# Patient Record
Sex: Male | Born: 1950 | Race: White | Hispanic: No | Marital: Single
Health system: Southern US, Community
[De-identification: ages and names within clinical notes are randomized; demographics above are authoritative.]

## PROBLEM LIST (undated history)

## (undated) DIAGNOSIS — K279 Peptic ulcer, site unspecified, unspecified as acute or chronic, without hemorrhage or perforation: Secondary | ICD-10-CM

## (undated) DIAGNOSIS — F1020 Alcohol dependence, uncomplicated: Secondary | ICD-10-CM

## (undated) DIAGNOSIS — I609 Nontraumatic subarachnoid hemorrhage, unspecified: Secondary | ICD-10-CM

## (undated) HISTORY — PX: BACK SURGERY: SHX140

## (undated) HISTORY — PX: OTHER SURGICAL HISTORY: SHX169

---

## 2003-08-04 ENCOUNTER — Emergency Department (HOSPITAL_COMMUNITY): Admission: EM | Admit: 2003-08-04 | Discharge: 2003-08-04 | Payer: Self-pay | Admitting: Emergency Medicine

## 2006-12-04 ENCOUNTER — Inpatient Hospital Stay (HOSPITAL_COMMUNITY): Admission: EM | Admit: 2006-12-04 | Discharge: 2006-12-07 | Payer: Self-pay | Admitting: Emergency Medicine

## 2006-12-04 ENCOUNTER — Emergency Department (HOSPITAL_COMMUNITY): Admission: EM | Admit: 2006-12-04 | Discharge: 2006-12-04 | Payer: Self-pay | Admitting: Emergency Medicine

## 2006-12-05 ENCOUNTER — Ambulatory Visit: Payer: Self-pay | Admitting: Gastroenterology

## 2006-12-06 ENCOUNTER — Encounter: Payer: Self-pay | Admitting: Internal Medicine

## 2006-12-06 ENCOUNTER — Ambulatory Visit: Payer: Self-pay | Admitting: Internal Medicine

## 2006-12-07 ENCOUNTER — Emergency Department (HOSPITAL_COMMUNITY): Admission: EM | Admit: 2006-12-07 | Discharge: 2006-12-07 | Payer: Self-pay | Admitting: Emergency Medicine

## 2010-11-02 NOTE — Discharge Summary (Signed)
NAMEBRODYN, DEPUY                ACCOUNT NO.:  [id number]   MEDICAL RECORD NO.:  [id number]          PATIENT TYPE:  INP   LOCATION:  A214                          FACILITY:  [facility]   PHYSICIAN:  Marcello Moores, MD   DATE OF BIRTH:  07-08-1950   DATE OF ADMISSION:  DATE OF DISCHARGE:  LH                               DISCHARGE SUMMARY   PRIMARY MEDICAL DOCTOR:  Unassigned.   DISCHARGE DIAGNOSES:  1. Alcohol intoxication, resolved.  2. Microcytic anemia of unknown etiology.  He will have follow-up with      gastroenterology as well as with hematologist.  3. Polysubstance abuse.   HOME MEDICATIONS:  1. Ferrous sulfate 325 mg p.o. t.i.d.  2. Thiamine 100 mg p.o. daily.  3. Folic acid 1 mg p.o. daily.  4. Multivitamin 1 tablet p.o. daily.   HOSPITAL COURSE:  Mr. Tantillo is a 60 year old male patient who presented  with alcohol intoxication and his alcohol level was more than 200, and  he was admitted to the hospital.  His hemoglobin was also low, 6.3, and  his MCV was 56, and the patient was transfused with 2 units of packed  red blood cells.  GI was consulted and evaluated by Dr. Roetta Sessions  The EGD finding was diffuse cobblestoning of the esophageal mucosa and  post Billroth of the esophageal mucosa and biopsy was taken, and a small  area hernia was also noticed and status post Billroth II second  hemigastrectomy was also noticed.  Colonoscopy was also done and showed  friable internal hemorrhoids and left-sided diverticula with small  descending colon __________, otherwise was normal and a biopsy was also  taken.  The stool guaiac was negative.  The patient was put on Protonix  and for his alcohol intoxication, he was put also on Ativan protocol and  the patient remained very stable.  His hemoglobin level post transfusion  and GI procedures was very stable.  His last hemoglobin before discharge  was 8.7 and hematocrit was 27.3 with MCV 67.5.  the patient remained  very  stable and he was assessed by ACT team for possible rehab, but he  declined it.  Today the patient was very stable with vital signs,  temperature 97, pulse rate 64 and respiratory rate 20 and blood pressure  111/68 and saturation 100%, and he is alert and well-oriented and there  was not any sign of alcohol withdrawal symptoms   DISCHARGE PLAN:  I discussed with the patient in detail about his anemia  and recognized origin and etiology and possible workup and I gave him a  strong recommendation to have follow-up with GI and follow for his  pathology result as well within 1-2 weeks, and appointment was given to  be evaluated by hematologist Dr. Mariel Sleet, and  strongly advised and he agreed, and I discussed with his sister, Windell Moulding,  also about his situation and the need of his follow-up, and both of them  understand and they agreed, and the patient was discharged with  supplementary medications.      Marcello Moores, MD  Electronically Signed  MT/MEDQ  D:  12/07/2006  T:  12/08/2006  Job:  [id number]

## 2010-11-02 NOTE — Op Note (Signed)
Joseph Townsend, Joseph Townsend                ACCOUNT NO.:  [id number]   MEDICAL RECORD NO.:  [id number]          PATIENT TYPE:  INP   LOCATION:  A214                          FACILITY:  [facility]   PHYSICIAN:  R. Roetta Sessions, M.D. DATE OF BIRTH:  08-26-1950   DATE OF PROCEDURE:  12/06/2006  DATE OF DISCHARGE:                               OPERATIVE REPORT   PROCEDURE:  Esophagogastroduodenoscopy with biopsy, esophageal  brushings, followed by colonoscopy and biopsy.   INDICATIONS FOR PROCEDURE:  60 year old Caucasian male alcoholic  admitted with profound anemia requiring transfusion. We do not have any  documentation that he has had GI bleeding.  He presented with a  hemoglobin in the 6 range and has required 4 units of packed red blood  cells, thus far. History with complicated peptic ulcer disease requiring  surgery decades ago at [facility]. EGD and colonoscopy are now  being done.  This approach has been discussed with the patient at  length.  The potential risks and benefits and have been reviewed,  questions answered, he is agreeable.  Please see documentation in the  medical record.   PROCEDURE NOTE:  O2 saturation, blood pressure, pulse oximetry were  monitored throughout the entirety of both procedures.  Conscious  sedation with Versed 6 mg IV and Demerol 100 mg IV in divided doses.  Cetacaine spray for topical pharyngeal anesthesia.  Instrumentation  Pentax video chip system.   ESOPHAGOGASTRODUODENOSCOPY FINDINGS:  Examination of the tubular  esophagus revealed diffusely abnormal esophageal mucosa with a  cobblestoning appearance to the mucosa.  This did not appear to be an  obvious viral or even fungal process, likewise, findings were atypical  for erosive reflux esophagitis.  There were no varices.  Please see  multiple photos taken.  The esophagus was patent and easily traversed.  Stomach has been altered surgically previously.  He has a Billroth II  configuration.  He  has a relatively small gastric pouch.  He has patent  efferent and afferent limbs.  The residual gastric mucosa appeared  normal.  Retroflexion of the proximal stomach esophagogastric junction  demonstrated only a small hiatal hernia.   THERAPEUTIC/DIAGNOSTIC MANEUVERS PERFORMED:  Biopsies and brushing of  the esophageal mucosa were taken for further studies.  The patient  tolerated the procedure well and was prepared for colonoscopy.   COLONOSCOPY FINDINGS:  Digital rectal examination revealed no  abnormalities.  The prep was marginal.  The colonic mucosa was surveyed  from the rectosigmoid junction to the left, transverse, right colon, to  the area of the appendiceal orifice and ileocecal valve and cecum.  These structures were well seen and photographed for the record.  From  this level, the scope was slowly withdrawn.  All previous mentioned  mucosal surfaces were again seen.  The patient had numerous left sided  diverticula and a diminutive 3-4 mm polyp in the mid descending colon  which was cold biopsied removed/.  The remainder of the colonic mucosa  appeared normal.  The scope was pulled down into the rectum where a  thorough examination of the rectal mucosa including  retroflexed view of  the anal verge demonstrated a prominent friable internal hemorrhoidal  plexus. Otherwise, the rectal mucosa appeared normal.  The patient  tolerated both procedures well and was reacted in endoscopy.   IMPRESSION:  EGD:  Diffuse cobblestoning of the esophageal mucosa,  this is abnormal mucosa, the etiology of which is not well defined at  this time, status post biopsy and KOH brush. Small hiatal hernia: Status  post Billroth II hemigastrectomy, residual gastric mucosa efferent and  afferent limbs appeared normal.  Colonoscopy:  Friable internal hemorrhoids, otherwise, normal rectum.  Left sided diverticula. Diminutive descending colon polyp, cold  biopsy/removed.  The remainder of the colonic  mucosa appeared normal.   RECOMMENDATIONS:  1. Continue proton pump inhibitor therapy.  2. Advance diet as tolerated.  3. Follow-up on path.  4. Would consider other (not GI) causes of anemia.  5. Recheck CBC tomorrow morning.   I discussed my findings with Lindaann Pascal, the patient's sister,  after the procedure.      Jonathon Bellows, M.D.  Electronically Signed     RMR/MEDQ  D:  12/06/2006  T:  12/06/2006  Job:  [id number]

## 2010-11-02 NOTE — H&P (Signed)
NAMECLAYBORN, Joseph Townsend                ACCOUNT NO.:  [id number]   MEDICAL RECORD NO.:  [id number]          PATIENT TYPE:  INP   LOCATION:  A214                          FACILITY:  [facility]   PHYSICIAN:  Skeet Latch, DO    DATE OF BIRTH:  09-Jun-1951   DATE OF ADMISSION:  12/04/2006  DATE OF DISCHARGE:  LH                              HISTORY & PHYSICAL   CHIEF COMPLAINT:  Alcohol intoxication and involuntary commitment.   HISTORY OF PRESENT ILLNESS:  This is a 60 year old male who presents  with alcohol intoxication.  Apparently, he was brought in I believe by  his wife secondary to his alcohol abuse.  The patient was in the  emergency room at [facility] yesterday and for the same  complaint and left against medical advice.  He returns with wife, who  wants the patient treated for his alcohol abuse.  On exam in the  emergency room, the patient was found to have a hemoglobin of 6.3, white  count 13.1, hematocrit of 20.4, MCV of 56.2.  The patient was  intoxicated, but cooperative.  He was found to have __________.   PAST MEDICAL HISTORY:  Positive for alcohol and tobacco abuse.   SURGICAL HISTORY:  Positive for back surgery secondary to ruptured disk  and surgery for I believe a stomach ulcer.   SOCIAL HISTORY:  He is a heavy drinker; he states he drinks wine daily.  He also states he is a pack-a-day smoker for approximately 40 years.  Denies any illicit drug use.   ALLERGIES:  No known drug allergies.   HOME MEDICATIONS:  None.   REVIEW OF SYSTEMS:  GENERAL:  No fever, chills, weight loss, weight gain  or fatigue.  GI:  Positive for some nausea and no vomiting or diarrhea.  Positive for some epigastric tenderness.  HEENT:  No diplopia, earache,  sore throat.  No visual disturbances.  NEUROLOGIC:  No headache,  confusion, weakness or dizziness.  PSYCHIATRIC:  No history of  depression or anxiety.  All other systems are negative.   PHYSICAL EXAMINATION:  GENERAL:  He is  hydrated, in no acute distress,  pleasant and cooperative and appears stated age.  VITAL SIGNS:  Temperature is 98.3, pulse 96, respirations 22, blood  pressure is 115/67.  The patient is saturating 97%.  HEENT:  Atraumatic, normocephalic.  PERRL.  EOMI.  NECK:  Supple, nontender and non-distended.  No JVD.  No thyromegaly  appreciated.  CARDIOVASCULAR:  Regular rate and rhythm.  No murmurs, rubs, or gallops.  RESPIRATORY:  Breath sounds are equal bilaterally.  No rales, rhonchi or  wheezing.  ABDOMEN:  Soft.  He had some tenderness in the epigastric region and  right upper quadrant on palpation, no masses, guarding or rebound  tenderness.  Positive bowel sounds.  EXTREMITIES:  Bilateral lower extremities cachectic.  No tenderness or  edema noted.  NEUROLOGIC:  Cranial nerves II-XII are grossly intact.  Sensation  normal.  He is alert and oriented x3.   LABORATORY DATA:  White blood cell count is 13.1, hemoglobin 6.3,  hematocrit 20.4,  MCV 66.2, platelets 390,000.  Sodium 136, potassium  4.8, chloride is 103, CO2 is 21, glucose 142, BUN 14, creatinine 1.01.  Calcium is 9.1.  His alcohol level was 229.  His urine drug screen was  negative.   ASSESSMENT:  This is a 60 year old who presents with alcohol  intoxication.  The patient was brought in I believe by his wife for his  alcohol abuse.  The patient was seen in the emergency room 1 day prior  for same complaint and left against medical advice.  He presents today  with the same complaint and states that he does want help at this time.  Upon evaluation in the emergency room, he was found to have a hemoglobin  of 6.3, but in no acute distress.   PLAN:  The patient will be admitted for anemia of chronic disease and  alcohol intoxication.  The patient will be transfused 2 units of packed  red blood cells and have a repeat H&H.  The patient will be placed on  Ativan withdrawal protocol and will have a sitter in the room with him.  The  patient will be maintained on IV hydration.  We will also get a  hepatitis and liver panel.  Blood cultures will also be performed.  The  patient will also have anemia panel including iron studies, B12 and  folate performed also.  The patient will need alcohol rehab performed as  an outpatient.  The patient will be followed closely.      Skeet Latch, DO  Electronically Signed     SM/MEDQ  D:  12/05/2006  T:  12/05/2006  Job:  [id number]

## 2010-11-02 NOTE — Consult Note (Signed)
NAMECREIG, LANDIN                ACCOUNT NO.:  [id number]   MEDICAL RECORD NO.:  [id number]          PATIENT TYPE:  INP   LOCATION:  A214                          FACILITY:  [facility]   PHYSICIAN:  Kassie Mends, M.D.      DATE OF BIRTH:  03-Oct-1950   DATE OF CONSULTATION:  12/05/2006  DATE OF DISCHARGE:                                 CONSULTATION   REASON FOR CONSULTATION:  Anemia.   REQUESTING PHYSICIAN:  Candace Bradley   HISTORY OF PRESENT ILLNESS:  Mr. Juncaj is a 60 year old male who has a  significant past medical history of peptic ulcer disease.  He is a poor  historian.  He reports that he had surgery for an ulcer at [facility] by Dr. Kerney Elbe who is currently deceased some years  ago.  He reports having perhaps two upper endoscopies.  He does not  recall ever having rubber bands placed at the base of his esophagus.  He  does have a history of alcohol abuse.  He denies vomiting up any blood.  He has not had any weight loss or decreased appetite.  He denies any  abdominal pain.  He denies any black stool that looks like tar.  He  denies any bright red blood per rectum.  He has not had any change in  his bowel habits.  His visit to the emergency department on the 16th was  uneventful and he was discharged.  In the emergency department, his  blood pressure was 131/66 and his pulse was 117.  His BUN was 14; in his  creatinine was 1.01.  The majority of the history is obtained from the  electronic medical record because the patient is a poor historia. He was  brought back to the emergency department on the 17th by his wife and  involuntarily committed. His lab evaluation revealed a hemoglobin of 6.3  with an MCV of 56.2.   PAST MEDICAL HISTORY:  1. Alcohol abuse.  2. Tobacco abuse.   PAST SURGICAL HISTORY:  1. Back surgery secondary to ruptured disk.  2. Abdominal surgery secondary to peptic ulcer disease.   ALLERGIES:  No known drug allergies.   MEDICATIONS:  1. Colace 100 mg daily.  2. Ativan/alcohol withdrawal protocol.  3. Multivitamin.  4. Protonix 40 mg daily.  5. Pneumovax times one.  6. Thiamine 100 mg daily.  7. Tylenol as needed.   FAMILY HISTORY:  He denies any family history of colon cancer or colon  polyps.   SOCIAL HISTORY:  He does drink alcohol.  The emergency department notes  states that he drinks 6  to 8 pints of wine per day.  He is married.  He  denies having any children.  He smokes.  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  He gives  his produce away.   REVIEW OF SYSTEMS:  He denied any nausea or vomiting.  He denied any  abdominal pain or diarrhea.  His review of systems is as per the HPI;  otherwise, all systems are  negative.   PHYSICAL EXAMINATION:  VITAL SIGNS:  Temperature 97.9, systolic blood  pressure 115 to 142, heart rate 71 to 83.  GENERAL:  He is in no apparent distress, alert and interactive.  He  answers questions appropriately.HEENT:  Atraumatic normocephalic.  Pupils equal and react to light.  Mouth no oral lesions.  Posterior  pharynx without erythema or exudate.NECK:  Full range of motion, no  lymphadenopathy.LUNGS:  Clear to auscultation bilaterally.  CARDIOVASCULAR EXAM:  Regular rhythm, no murmur, normal S1 and  S2.ABDOMEN:  Bowel sounds are present, soft, nontender, nondistended.  No rebound or guarding.  He has an incision on his abdomen, which is  well healed.  No abdominal bruits or pulsatile masses. EXTREMITIES:  Without clubbing, cyanosis or edema.  NEUROLOGIC:  He has no focal or neurologic deficits.   LABS:  White count 9.6, hemoglobin 6.7, platelet count 176.  BUN 14,  creatinine 1.0, albumin 4.5, AST 23, ALT 15, calcium 9.1.  Alcohol level  229.   RADIOGRAPHIC STUDIES:  Chest x-ray PA and lateral-June 17:  Bronchitic  changes and questionable early COPD.   ASSESSMENT:  Mr. Repka is a 60 year old male with a  microcytic anemia.  He has a pending ferritin level.  The differential diagnoses includes:  Colorectal polyp, arteriovenous malformation, peptic ulcer disease and a  low likelihood of a GI malignancy.  He has no evidence of active GI  bleeding.  He did receive two units of packed red blood cells and had an  inappropriate transfusion response, because his hemoglobin only  increased from 6.3 to 6.7.  However, he is hemodynamically stable.  His  inappropriate transfusion response could just be a matter of  equilibration.  Thank you for allowing me to see Mr. Poche in  consultation.  My recommendations follow.   RECOMMENDATIONS:  1. Plan to perform a colonoscopy, after a GoLYTELY bowel prep on December 06, 2006.  If no source for his microcytic anemia can be      identified, then an EGD will be performed.  2. Clear liquid diet, today, and then NPO after midnight except for      meds.  3. Begin Protonix 40 mg daily for gastrointestinal prophylaxis.  4. If he were to manifest evidence of an active gastrointestinal bleed      tonight, then would plan to do an EGD this evening.      Kassie Mends, M.D.  Electronically Signed     SM/MEDQ  D:  12/05/2006  T:  12/05/2006  Job:  [id number]

## 2011-04-06 LAB — BASIC METABOLIC PANEL
GFR calc non Af Amer: >60
Potassium: 4.8
Sodium: 136

## 2011-04-06 LAB — DIFFERENTIAL
Band Neutrophils: 0
Basophils Absolute: 0.1
Basophils Relative: 1
Basophils Relative: 4 — ABNORMAL HIGH
Blasts: 0
Eosinophils Absolute: 0.1
Eosinophils Relative: 1
Eosinophils Relative: 6 — ABNORMAL HIGH
Lymphocytes Relative: 14
Lymphocytes Relative: 15
Lymphs Abs: 1.5
Lymphs Abs: 1.6
Monocytes Absolute: 0.6
Monocytes Absolute: 0.8 — ABNORMAL HIGH
Monocytes Relative: 11
Monocytes Relative: 2 — ABNORMAL LOW
Monocytes Relative: 9
Monocytes Relative: 9
Neutro Abs: 10.4 — ABNORMAL HIGH
Neutro Abs: 4.4
Neutro Abs: 6.8
nRBC: 0

## 2011-04-06 LAB — CBC
HCT: 20.4 — ABNORMAL LOW
HCT: 21.2 — ABNORMAL LOW
HCT: 27.3 — ABNORMAL LOW
Hemoglobin: 6.3 — CL
Hemoglobin: 6.7 — CL
Hemoglobin: 8.4 — ABNORMAL LOW
Hemoglobin: 8.5 — ABNORMAL LOW
Hemoglobin: 8.7 — ABNORMAL LOW
MCV: 61.4 — ABNORMAL LOW
MCV: 67.5 — ABNORMAL LOW
Platelets: 154
RBC: 3.45 — ABNORMAL LOW
RBC: 3.64 — ABNORMAL LOW
RBC: 3.99 — ABNORMAL LOW
RDW: 32.8 — ABNORMAL HIGH
RDW: 33.7 — ABNORMAL HIGH
WBC: 13.1 — ABNORMAL HIGH
WBC: 8.3
WBC: 9.6

## 2011-04-06 LAB — CROSSMATCH
ABO/RH(D): O POS
Antibody Screen: NEGATIVE

## 2011-04-06 LAB — RAPID URINE DRUG SCREEN, HOSP PERFORMED
Benzodiazepines: NOT DETECTED
Cocaine: NOT DETECTED
Opiates: NOT DETECTED
Tetrahydrocannabinol: NOT DETECTED

## 2011-04-06 LAB — HEMOGLOBIN AND HEMATOCRIT, BLOOD
HCT: 23.5 — ABNORMAL LOW
Hemoglobin: 7.6 — CL

## 2011-04-06 LAB — HEPATIC FUNCTION PANEL
Bilirubin, Direct: 0.2
Indirect Bilirubin: 0.4
Total Bilirubin: 0.6

## 2011-04-06 LAB — VITAMIN B12: Vitamin B-12: 131 — ABNORMAL LOW (ref 211–911)

## 2011-04-06 LAB — ABO/RH: ABO/RH(D): O POS

## 2011-04-06 LAB — HEPATITIS PANEL, ACUTE
HCV Ab: NEGATIVE
Hep A IgM: NEGATIVE
Hep B C IgM: NEGATIVE
Hepatitis B Surface Ag: NEGATIVE

## 2011-04-06 LAB — CULTURE, BLOOD (ROUTINE X 2)

## 2011-04-06 LAB — KOH PREP

## 2011-04-06 LAB — ETHANOL: Alcohol, Ethyl (B): 229 — ABNORMAL HIGH

## 2012-09-06 ENCOUNTER — Other Ambulatory Visit (HOSPITAL_COMMUNITY): Payer: Self-pay | Admitting: *Deleted

## 2012-09-06 ENCOUNTER — Ambulatory Visit (HOSPITAL_COMMUNITY)
Admission: RE | Admit: 2012-09-06 | Discharge: 2012-09-06 | Disposition: A | Discharge status: Home / Self Care | Payer: Disability Insurance | Source: Ambulatory Visit | Attending: Obstetrics and Gynecology | Admitting: Obstetrics and Gynecology

## 2012-09-06 DIAGNOSIS — M533 Sacrococcygeal disorders, not elsewhere classified: Secondary | ICD-10-CM | POA: Insufficient documentation

## 2012-09-06 DIAGNOSIS — M545 Low back pain, unspecified: Secondary | ICD-10-CM | POA: Insufficient documentation

## 2012-11-29 ENCOUNTER — Encounter (HOSPITAL_COMMUNITY): Payer: Self-pay | Admitting: *Deleted

## 2012-11-29 ENCOUNTER — Emergency Department (HOSPITAL_COMMUNITY)
Admission: EM | Admit: 2012-11-29 | Discharge: 2012-11-29 | Discharge status: Against Medical Advice | Payer: Medicaid Other | Attending: Emergency Medicine | Admitting: Emergency Medicine

## 2012-11-29 DIAGNOSIS — S8990XA Unspecified injury of unspecified lower leg, initial encounter: Secondary | ICD-10-CM | POA: Insufficient documentation

## 2012-11-29 DIAGNOSIS — S99919A Unspecified injury of unspecified ankle, initial encounter: Secondary | ICD-10-CM | POA: Insufficient documentation

## 2012-11-29 DIAGNOSIS — Y9229 Other specified public building as the place of occurrence of the external cause: Secondary | ICD-10-CM | POA: Insufficient documentation

## 2012-11-29 DIAGNOSIS — R296 Repeated falls: Secondary | ICD-10-CM | POA: Insufficient documentation

## 2012-11-29 DIAGNOSIS — F17200 Nicotine dependence, unspecified, uncomplicated: Secondary | ICD-10-CM | POA: Insufficient documentation

## 2012-11-29 DIAGNOSIS — Y9389 Activity, other specified: Secondary | ICD-10-CM | POA: Insufficient documentation

## 2012-11-29 NOTE — ED Notes (Signed)
Pt called for third time, no pt found

## 2012-11-29 NOTE — ED Notes (Signed)
cbg 113 per [facility], pt admits to drinking several beers, [place]

## 2012-11-29 NOTE — ED Notes (Signed)
Pt states that his "legs don't wanna act right", pain to bilateral legs, states "they won't move", reported that ETOH on board

## 2012-11-29 NOTE — ED Notes (Signed)
Called for pt x 2 without response

## 2014-10-03 ENCOUNTER — Encounter (HOSPITAL_COMMUNITY): Payer: Self-pay | Admitting: Emergency Medicine

## 2014-10-03 ENCOUNTER — Emergency Department (HOSPITAL_COMMUNITY)
Admission: EM | Admit: 2014-10-03 | Discharge: 2014-10-03 | Discharge status: Against Medical Advice | Payer: Medicaid Other | Attending: Emergency Medicine | Admitting: Emergency Medicine

## 2014-10-03 ENCOUNTER — Emergency Department (HOSPITAL_COMMUNITY): Payer: Medicaid Other

## 2014-10-03 DIAGNOSIS — R079 Chest pain, unspecified: Secondary | ICD-10-CM | POA: Diagnosis present

## 2014-10-03 DIAGNOSIS — R0781 Pleurodynia: Secondary | ICD-10-CM | POA: Diagnosis not present

## 2014-10-03 DIAGNOSIS — R0602 Shortness of breath: Secondary | ICD-10-CM | POA: Diagnosis not present

## 2014-10-03 DIAGNOSIS — Z720 Tobacco use: Secondary | ICD-10-CM | POA: Insufficient documentation

## 2014-10-03 DIAGNOSIS — R05 Cough: Secondary | ICD-10-CM | POA: Insufficient documentation

## 2014-10-03 LAB — COMPREHENSIVE METABOLIC PANEL
ALK PHOS: 80 U/L (ref 39–117)
ALT: 12 U/L (ref 0–53)
ANION GAP: 9 (ref 5–15)
AST: 19 U/L (ref 0–37)
Albumin: 4.3 g/dL (ref 3.5–5.2)
BILIRUBIN TOTAL: 0.5 mg/dL (ref 0.3–1.2)
BUN: 7 mg/dL (ref 6–23)
CALCIUM: 8.7 mg/dL (ref 8.4–10.5)
CO2: 25 mmol/L (ref 19–32)
Chloride: 97 mmol/L (ref 96–112)
Creatinine, Ser: 0.61 mg/dL (ref 0.50–1.35)
GFR calc non Af Amer: >90 mL/min (ref >90)
GLUCOSE: 100 mg/dL — AB (ref 70–99)
POTASSIUM: 3.6 mmol/L (ref 3.5–5.1)
Sodium: 131 mmol/L — ABNORMAL LOW (ref 135–145)
TOTAL PROTEIN: 7.3 g/dL (ref 6.0–8.3)

## 2014-10-03 LAB — CBC WITH DIFFERENTIAL/PLATELET
BASOS ABS: 0 10*3/uL (ref 0.0–0.1)
BASOS PCT: 0 % (ref 0–1)
Eosinophils Absolute: 0.4 10*3/uL (ref 0.0–0.7)
Eosinophils Relative: 4 % (ref 0–5)
HEMATOCRIT: 46.1 % (ref 39.0–52.0)
HEMOGLOBIN: 16.2 g/dL (ref 13.0–17.0)
Lymphocytes Relative: 42 % (ref 12–46)
Lymphs Abs: 4.1 10*3/uL — ABNORMAL HIGH (ref 0.7–4.0)
MCH: 31.6 pg (ref 26.0–34.0)
MCHC: 35.1 g/dL (ref 30.0–36.0)
MCV: 90 fL (ref 78.0–100.0)
Monocytes Absolute: 0.9 10*3/uL (ref 0.1–1.0)
Monocytes Relative: 9 % (ref 3–12)
NEUTROS ABS: 4.4 10*3/uL (ref 1.7–7.7)
NEUTROS PCT: 45 % (ref 43–77)
PLATELETS: 193 10*3/uL (ref 150–400)
RBC: 5.12 MIL/uL (ref 4.22–5.81)
RDW: 13.2 % (ref 11.5–15.5)
WBC: 9.9 10*3/uL (ref 4.0–10.5)

## 2014-10-03 LAB — TROPONIN I

## 2014-10-03 NOTE — ED Notes (Signed)
Patient leaving AMA. Explained risks, no concerns voiced, pt insisted on leaving.

## 2014-10-03 NOTE — ED Provider Notes (Signed)
CSN: [id number]     Arrival date & time 10/03/14  0231 History   First MD Initiated Contact with Patient 10/03/14 [phone]     Chief Complaint  Patient presents with  . Chest Pain     (Consider location/radiation/quality/duration/timing/severity/associated sxs/prior Treatment) HPI Comments: Pt comes in with cc of chest pain. Chest pain is left sided, described as quite constant, worse with deep inspiration and with coughing. Cough is dry. No hx of COPD, asthma, but pt is a 1 ppd smoker. No n/v/f/c. No cardiac hx. Pain is also not exertional, in fact pt walked to the ER from his home. Pt has no medical hx and is taking no meds.  Patient is a 64 y.o. male presenting with chest pain. The history is provided by the patient.  Chest Pain Associated symptoms: cough and shortness of breath   Associated symptoms: no abdominal pain and no palpitations     History reviewed. No pertinent past medical history. Past Surgical History  Procedure Laterality Date  . Abdominal surgery    . Back surgery     No family history on file. History  Substance Use Topics  . Smoking status: Current Every Day Smoker    Types: Cigarettes  . Smokeless tobacco: Not on file  . Alcohol Use: Yes    Review of Systems  Constitutional: Negative for activity change, appetite change and unexpected weight change.  Respiratory: Positive for cough and shortness of breath. Negative for wheezing.   Cardiovascular: Positive for chest pain. Negative for palpitations.  Gastrointestinal: Negative for abdominal pain.  Genitourinary: Negative for dysuria.  Allergic/Immunologic: Negative for immunocompromised state.      Allergies  Review of patient's allergies indicates no known allergies.  Home Medications   Prior to Admission medications   Not on File   BP 109/72 mmHg  Pulse 87  Resp 14  Wt 145 lb (65.772 kg)  SpO2 96% Physical Exam  Constitutional: He is oriented to person, place, and time. He appears  well-developed.  HENT:  Head: Normocephalic and atraumatic.  Eyes: Conjunctivae and EOM are normal. Pupils are equal, round, and reactive to light.  Neck: Normal range of motion. Neck supple.  Cardiovascular: Normal rate and regular rhythm.   Pulmonary/Chest: Effort normal and breath sounds normal. No respiratory distress. He has no wheezes. He exhibits tenderness.  Reproducible chest wall tenderness, L sided, where there appears to be small nodule, mobile  Abdominal: Soft. Bowel sounds are normal. He exhibits no distension. There is no tenderness. There is no rebound and no guarding.  Neurological: He is alert and oriented to person, place, and time.  Skin: Skin is warm.  Nursing note and vitals reviewed.   ED Course  Procedures (including critical care time) Labs Review Labs Reviewed  CBC WITH DIFFERENTIAL/PLATELET - Abnormal; Notable for the following:    Lymphs Abs 4.1 (*)    All other components within normal limits  COMPREHENSIVE METABOLIC PANEL - Abnormal; Notable for the following:    Sodium 131 (*)    Glucose, Bld 100 (*)    All other components within normal limits  TROPONIN I    Imaging Review Dg Chest Portable 1 View  10/03/2014   CLINICAL DATA:  Left-sided chest pain for 2 days  EXAM: PORTABLE CHEST - 1 VIEW  COMPARISON:  12/05/2006  FINDINGS: Hyperinflation and interstitial coarsening. There is an indistinct right infrahilar lung opacity. No edema, effusion, or pneumothorax. Normal heart size and aortic contours. No acute osseous findings.  IMPRESSION:  1. No explanation for left chest pain. 2. Emphysema. 3. Mild right infrahilar density favors atelectasis.   Electronically Signed   By: Marnee Spring M.D.   On: 10/03/2014 04:16     EKG Interpretation   Date/Time:  Friday October 03 2014 02:43:33 EDT Ventricular Rate:  72 PR Interval:  193 QRS Duration: 109 QT Interval:  423 QTC Calculation: 463 R Axis:   12 Text Interpretation:  Sinus rhythm Probable  anteroseptal infarct, old  Nonspecific ST and T wave abnormality No acute changes No old tracing to  compare Confirmed by Rhunette Croft, MD, Janey Genta [phone]) on 10/03/2014 3:12:57 AM      MDM   Final diagnoses:  Pleuritic chest pain    Pt with chest pain. Pleuritic type. He has clear lung exam. Chest pain is reproducible with palpation as well. Pt has no hx of PE, DVT and denies any exogenous estrogen use, long distance travels or surgery in the past 6 weeks, active cancer, recent immobilization. Low concerns for cardiac cause. Pt eloped....    Derwood Kaplan, MD 10/03/14 [phone]

## 2014-10-03 NOTE — ED Notes (Signed)
Patient c/o left sided chest pain x 2 days; denies diaphoresis, shortness of breath or nausea.  Patient states it hurts worse to pull things with his arms.

## 2014-11-15 ENCOUNTER — Encounter (HOSPITAL_COMMUNITY): Payer: Self-pay | Admitting: Cardiology

## 2014-11-15 ENCOUNTER — Inpatient Hospital Stay (HOSPITAL_COMMUNITY)
Admission: EM | Admit: 2014-11-15 | Discharge: 2014-11-18 | DRG: 394 | Disposition: A | Discharge status: Home / Self Care | Payer: Medicaid Other | Attending: Family Medicine | Admitting: Family Medicine

## 2014-11-15 ENCOUNTER — Emergency Department (HOSPITAL_COMMUNITY): Payer: Medicaid Other

## 2014-11-15 DIAGNOSIS — E440 Moderate protein-calorie malnutrition: Secondary | ICD-10-CM | POA: Insufficient documentation

## 2014-11-15 DIAGNOSIS — F1910 Other psychoactive substance abuse, uncomplicated: Secondary | ICD-10-CM | POA: Diagnosis present

## 2014-11-15 DIAGNOSIS — K610 Anal abscess: Principal | ICD-10-CM

## 2014-11-15 DIAGNOSIS — K6289 Other specified diseases of anus and rectum: Secondary | ICD-10-CM | POA: Diagnosis present

## 2014-11-15 DIAGNOSIS — F1010 Alcohol abuse, uncomplicated: Secondary | ICD-10-CM

## 2014-11-15 DIAGNOSIS — E871 Hypo-osmolality and hyponatremia: Secondary | ICD-10-CM

## 2014-11-15 DIAGNOSIS — Z903 Acquired absence of stomach [part of]: Secondary | ICD-10-CM | POA: Diagnosis present

## 2014-11-15 DIAGNOSIS — Z8711 Personal history of peptic ulcer disease: Secondary | ICD-10-CM | POA: Diagnosis not present

## 2014-11-15 DIAGNOSIS — F17210 Nicotine dependence, cigarettes, uncomplicated: Secondary | ICD-10-CM | POA: Diagnosis present

## 2014-11-15 DIAGNOSIS — Z6821 Body mass index (BMI) 21.0-21.9, adult: Secondary | ICD-10-CM | POA: Diagnosis not present

## 2014-11-15 HISTORY — DX: Peptic ulcer, site unspecified, unspecified as acute or chronic, without hemorrhage or perforation: K27.9

## 2014-11-15 LAB — CBC WITH DIFFERENTIAL/PLATELET
BASOS ABS: 0 10*3/uL (ref 0.0–0.1)
Basophils Relative: 0 % (ref 0–1)
EOS ABS: 0 10*3/uL (ref 0.0–0.7)
Eosinophils Relative: 0 % (ref 0–5)
HCT: 41.5 % (ref 39.0–52.0)
HEMOGLOBIN: 14.5 g/dL (ref 13.0–17.0)
LYMPHS PCT: 7 % — AB (ref 12–46)
Lymphs Abs: 1.1 10*3/uL (ref 0.7–4.0)
MCH: 30.2 pg (ref 26.0–34.0)
MCHC: 34.9 g/dL (ref 30.0–36.0)
MCV: 86.5 fL (ref 78.0–100.0)
MONO ABS: 1.5 10*3/uL — AB (ref 0.1–1.0)
Monocytes Relative: 10 % (ref 3–12)
NEUTROS ABS: 13 10*3/uL — AB (ref 1.7–7.7)
Neutrophils Relative %: 83 % — ABNORMAL HIGH (ref 43–77)
PLATELETS: 231 10*3/uL (ref 150–400)
RBC: 4.8 MIL/uL (ref 4.22–5.81)
RDW: 13.7 % (ref 11.5–15.5)
WBC: 15.6 10*3/uL — ABNORMAL HIGH (ref 4.0–10.5)

## 2014-11-15 LAB — BASIC METABOLIC PANEL
ANION GAP: 14 (ref 5–15)
Anion gap: 10 (ref 5–15)
BUN: 7 mg/dL (ref 6–20)
BUN: 6 mg/dL (ref 6–20)
CHLORIDE: 91 mmol/L — AB (ref 101–111)
CO2: 21 mmol/L — AB (ref 22–32)
CO2: 24 mmol/L (ref 22–32)
Calcium: 8.9 mg/dL (ref 8.9–10.3)
Calcium: 8.3 mg/dL — ABNORMAL LOW (ref 8.9–10.3)
Chloride: 86 mmol/L — ABNORMAL LOW (ref 101–111)
Creatinine, Ser: 0.73 mg/dL (ref 0.61–1.24)
Creatinine, Ser: 0.68 mg/dL (ref 0.61–1.24)
GFR calc Af Amer: >60 mL/min (ref >60)
GFR calc non Af Amer: >60 mL/min (ref >60)
GLUCOSE: 123 mg/dL — AB (ref 65–99)
Glucose, Bld: 106 mg/dL — ABNORMAL HIGH (ref 65–99)
POTASSIUM: 3.7 mmol/L (ref 3.5–5.1)
Potassium: 3.6 mmol/L (ref 3.5–5.1)
SODIUM: 125 mmol/L — AB (ref 135–145)
Sodium: 121 mmol/L — ABNORMAL LOW (ref 135–145)

## 2014-11-15 LAB — HEPATIC FUNCTION PANEL
ALT: 20 U/L (ref 17–63)
AST: 24 U/L (ref 15–41)
Albumin: 3.7 g/dL (ref 3.5–5.0)
Alkaline Phosphatase: 117 U/L (ref 38–126)
BILIRUBIN DIRECT: 0.3 mg/dL (ref 0.1–0.5)
BILIRUBIN INDIRECT: 0.8 mg/dL (ref 0.3–0.9)
BILIRUBIN TOTAL: 1.1 mg/dL (ref 0.3–1.2)
Total Protein: 7.9 g/dL (ref 6.5–8.1)

## 2014-11-15 LAB — BASIC METABOLIC PANEL WITH GFR
Anion gap: 8 (ref 5–15)
BUN: 7 mg/dL (ref 6–20)
CO2: 24 mmol/L (ref 22–32)
Calcium: 8.1 mg/dL — ABNORMAL LOW (ref 8.9–10.3)
Chloride: 93 mmol/L — ABNORMAL LOW (ref 101–111)
Creatinine, Ser: 0.74 mg/dL (ref 0.61–1.24)
GFR calc Af Amer: >60 mL/min
GFR calc non Af Amer: >60 mL/min
Glucose, Bld: 90 mg/dL (ref 65–99)
Potassium: 3.6 mmol/L (ref 3.5–5.1)
Sodium: 125 mmol/L — ABNORMAL LOW (ref 135–145)

## 2014-11-15 LAB — LACTIC ACID, PLASMA
LACTIC ACID, VENOUS: 1 mmol/L (ref 0.5–2.0)
Lactic Acid, Venous: 1 mmol/L (ref 0.5–2.0)

## 2014-11-15 LAB — MAGNESIUM: Magnesium: 1.7 mg/dL (ref 1.7–2.4)

## 2014-11-15 LAB — ETHANOL: Alcohol, Ethyl (B): <5 mg/dL (ref <5)

## 2014-11-15 LAB — PROTIME-INR
INR: 1.12 (ref 0.00–1.49)
PROTHROMBIN TIME: 14.6 s (ref 11.6–15.2)

## 2014-11-15 MED ORDER — SODIUM CHLORIDE 0.9 % IV SOLN
INTRAVENOUS | Status: AC
  Filled 2014-11-15: qty 3

## 2014-11-15 MED ORDER — TOBRAMYCIN 0.3 % OP SOLN
1.0000 [drp] | Freq: Every day | OPHTHALMIC | Status: DC
  Administered 2014-11-15 – 2014-11-18 (×4): 1 [drp] via OPHTHALMIC
  Filled 2014-11-15: qty 5

## 2014-11-15 MED ORDER — ACETAMINOPHEN 650 MG RE SUPP: 650.0000 mg | Freq: Four times a day (QID) | RECTAL | Status: DC | PRN

## 2014-11-15 MED ORDER — ADULT MULTIVITAMIN W/MINERALS CH
1.0000 | ORAL_TABLET | Freq: Every day | ORAL | Status: DC
  Administered 2014-11-15 – 2014-11-18 (×4): 1 via ORAL
  Filled 2014-11-15 (×4): qty 1

## 2014-11-15 MED ORDER — SODIUM CHLORIDE 0.9 % IV SOLN
INTRAVENOUS | Status: DC
  Administered 2014-11-15: 11:00:00 via INTRAVENOUS

## 2014-11-15 MED ORDER — MORPHINE SULFATE 4 MG/ML IJ SOLN: 4.0000 mg | INTRAMUSCULAR | Status: DC | PRN

## 2014-11-15 MED ORDER — SODIUM CHLORIDE 0.9 % IV SOLN
3.0000 g | Freq: Four times a day (QID) | INTRAVENOUS | Status: DC
  Administered 2014-11-15 – 2014-11-16 (×3): 3 g via INTRAVENOUS
  Filled 2014-11-15 (×8): qty 3

## 2014-11-15 MED ORDER — LORAZEPAM 2 MG/ML IJ SOLN: 1.0000 mg | Freq: Four times a day (QID) | INTRAMUSCULAR | Status: DC | PRN

## 2014-11-15 MED ORDER — THIAMINE HCL 100 MG/ML IJ SOLN: 100.0000 mg | Freq: Every day | INTRAMUSCULAR | Status: DC

## 2014-11-15 MED ORDER — MORPHINE SULFATE 4 MG/ML IJ SOLN
4.0000 mg | INTRAMUSCULAR | Status: AC | PRN
  Administered 2014-11-15 (×2): 4 mg via INTRAVENOUS
  Filled 2014-11-15 (×2): qty 1

## 2014-11-15 MED ORDER — MORPHINE SULFATE 4 MG/ML IJ SOLN
4.0000 mg | INTRAMUSCULAR | Status: DC | PRN
  Administered 2014-11-15 – 2014-11-17 (×8): 4 mg via INTRAVENOUS
  Filled 2014-11-15 (×8): qty 1

## 2014-11-15 MED ORDER — ONDANSETRON HCL 4 MG/2ML IJ SOLN: 4.0000 mg | Freq: Four times a day (QID) | INTRAMUSCULAR | Status: DC | PRN

## 2014-11-15 MED ORDER — CIPROFLOXACIN IN D5W 400 MG/200ML IV SOLN
400.0000 mg | Freq: Once | INTRAVENOUS | Status: AC
  Administered 2014-11-15: 400 mg via INTRAVENOUS
  Filled 2014-11-15: qty 200

## 2014-11-15 MED ORDER — SODIUM CHLORIDE 0.9 % IV SOLN
INTRAVENOUS | Status: DC
  Administered 2014-11-15 – 2014-11-16 (×3): via INTRAVENOUS

## 2014-11-15 MED ORDER — VITAMIN B-1 100 MG PO TABS
100.0000 mg | ORAL_TABLET | Freq: Every day | ORAL | Status: DC
  Administered 2014-11-15 – 2014-11-18 (×4): 100 mg via ORAL
  Filled 2014-11-15 (×4): qty 1

## 2014-11-15 MED ORDER — METRONIDAZOLE IN NACL 5-0.79 MG/ML-% IV SOLN
500.0000 mg | Freq: Once | INTRAVENOUS | Status: AC
  Administered 2014-11-15: 500 mg via INTRAVENOUS
  Filled 2014-11-15: qty 100

## 2014-11-15 MED ORDER — LORAZEPAM 1 MG PO TABS: 1.0000 mg | ORAL_TABLET | Freq: Four times a day (QID) | ORAL | Status: DC | PRN

## 2014-11-15 MED ORDER — ONDANSETRON HCL 4 MG PO TABS: 4.0000 mg | ORAL_TABLET | Freq: Four times a day (QID) | ORAL | Status: DC | PRN

## 2014-11-15 MED ORDER — CIPROFLOXACIN IN D5W 400 MG/200ML IV SOLN: 400.0000 mg | Freq: Two times a day (BID) | INTRAVENOUS | Status: DC

## 2014-11-15 MED ORDER — FOLIC ACID 1 MG PO TABS
1.0000 mg | ORAL_TABLET | Freq: Every day | ORAL | Status: DC
  Administered 2014-11-15 – 2014-11-18 (×4): 1 mg via ORAL
  Filled 2014-11-15 (×4): qty 1

## 2014-11-15 MED ORDER — HYDROCODONE-ACETAMINOPHEN 5-325 MG PO TABS
1.0000 | ORAL_TABLET | ORAL | Status: DC | PRN
  Administered 2014-11-16 (×2): 1 via ORAL
  Administered 2014-11-17 – 2014-11-18 (×6): 2 via ORAL
  Filled 2014-11-15 (×2): qty 2
  Filled 2014-11-15: qty 1
  Filled 2014-11-15 (×2): qty 2
  Filled 2014-11-15: qty 1
  Filled 2014-11-15 (×2): qty 2

## 2014-11-15 MED ORDER — ACETAMINOPHEN 325 MG PO TABS: 650.0000 mg | ORAL_TABLET | Freq: Four times a day (QID) | ORAL | Status: DC | PRN

## 2014-11-15 MED ORDER — IOHEXOL 300 MG/ML  SOLN
100.0000 mL | Freq: Once | INTRAMUSCULAR | Status: AC | PRN
  Administered 2014-11-15: 100 mL via INTRAVENOUS

## 2014-11-15 NOTE — ED Provider Notes (Signed)
CSN: [id number]     Arrival date & time 11/15/14  0944 History   First MD Initiated Contact with Patient 11/15/14 1010     Chief Complaint  Patient presents with  . Rectal Pain      HPI Pt was seen at 1015. Per pt, c/o gradual onset and worsening of constant rectal "pain" for the past 3 days. Pt states he has been having "normal BM's" despite the pain. States he is unable to sit down due to the pain in his rectum. Denies rectal discharge, no rectal bleeding, no injury, no abd pain, no N/V/D, no fevers.    Past Medical History  Diagnosis Date  . PUD (peptic ulcer disease)    Past Surgical History  Procedure Laterality Date  . Abdominal surgery    . Back surgery      History  Substance Use Topics  . Smoking status: Current Every Day Smoker    Types: Cigarettes  . Smokeless tobacco: Not on file  . Alcohol Use: Yes     Comment: has been a while    Review of Systems ROS: Statement: All systems negative except as marked or noted in the HPI; Constitutional: Negative for fever and chills. ; ; Eyes: Negative for eye pain, redness and discharge. ; ; ENMT: Negative for ear pain, hoarseness, nasal congestion, sinus pressure and sore throat. ; ; Cardiovascular: Negative for chest pain, palpitations, diaphoresis, dyspnea and peripheral edema. ; ; Respiratory: Negative for cough, wheezing and stridor. ; ; Gastrointestinal: +rectal pain. Negative for nausea, vomiting, diarrhea, abdominal pain, blood in stool, hematemesis, jaundice and rectal bleeding. . ; ; Genitourinary: Negative for dysuria, flank pain and hematuria. ; ; Musculoskeletal: Negative for back pain and neck pain. Negative for swelling and trauma.; ; Skin: Negative for pruritus, rash, abrasions, blisters, bruising and skin lesion.; ; Neuro: Negative for headache, lightheadedness and neck stiffness. Negative for weakness, altered level of consciousness , altered mental status, extremity weakness, paresthesias, involuntary movement,  seizure and syncope.      Allergies  Review of patient's allergies indicates no known allergies.  Home Medications   Prior to Admission medications   Not on File   BP 105/63 mmHg  Pulse 85  Temp(Src) 98.3 F (36.8 C) (Oral)  Resp 18  SpO2 100% Physical Exam  1020: Physical examination:  Nursing notes reviewed; Vital signs and O2 SAT reviewed;  Constitutional: Well developed, Well nourished, Well hydrated, Uncomfortable appearing.; Head:  Normocephalic, atraumatic; Eyes: EOMI, PERRL, No scleral icterus; ENMT: Mouth and pharynx normal, Mucous membranes moist; Neck: Supple, Full range of motion, No lymphadenopathy; Cardiovascular: Regular rate and rhythm, No gallop; Respiratory: Breath sounds clear & equal bilaterally, No rales, rhonchi, wheezes.  Speaking full sentences with ease, Normal respiratory effort/excursion; Chest: Nontender, Movement normal; Abdomen: Soft, Nontender, Nondistended, Normal bowel sounds. Rectal exam performed w/permission of pt and ED RN chaperone present.  Anal tone normal. +TTP inside posterior rectal canal area, and purulent discharge is expressed from anal opening when area is palpated.  No fissures, +small external hemorrhoid without thrombosis or bleeding. No large palp masses. No rectal bleeding. Skin on buttocks and perineum appears intact and without obvious open wounds, erythema or fluctuance.  No perineal erythema or soft tissue crepitus.;; Genitourinary: No CVA tenderness; Extremities: Pulses normal, No tenderness, No edema, No calf edema or asymmetry.; Neuro: AA&Ox3, vague historian. Major CN grossly intact.  Speech clear. No gross focal motor or sensory deficits in extremities. Climbs on and off stretcher easily by himself.  Gait steady.; Skin: Color normal, Warm, Dry.     ED Course  Procedures     EKG Interpretation None      MDM  MDM Reviewed: previous chart, nursing note and vitals Reviewed previous: labs Interpretation: labs and CT  scan Total time providing critical care: 30-74 minutes. This excludes time spent performing separately reportable procedures and services. Consults: admitting MD     CRITICAL CARE Performed by: Laray Anger Total critical care time: 35 Critical care time was exclusive of separately billable procedures and treating other patients. Critical care was necessary to treat or prevent imminent or life-threatening deterioration. Critical care was time spent personally by me on the following activities: development of treatment plan with patient and/or surrogate as well as nursing, discussions with consultants, evaluation of patient's response to treatment, examination of patient, obtaining history from patient or surrogate, ordering and performing treatments and interventions, ordering and review of laboratory studies, ordering and review of radiographic studies, pulse oximetry and re-evaluation of patient's condition.  Results for orders placed or performed during the hospital encounter of 11/15/14  Basic metabolic panel  Result Value Ref Range   Sodium 121 (L) 135 - 145 mmol/L   Potassium 3.7 3.5 - 5.1 mmol/L   Chloride 86 (L) 101 - 111 mmol/L   CO2 21 (L) 22 - 32 mmol/L   Glucose, Bld 106 (H) 65 - 99 mg/dL   BUN 7 6 - 20 mg/dL   Creatinine, Ser 1.61 0.61 - 1.24 mg/dL   Calcium 8.9 8.9 - 09.6 mg/dL   GFR calc non Af Amer >60 >60 mL/min   GFR calc Af Amer >60 >60 mL/min   Anion gap 14 5 - 15  CBC with Differential  Result Value Ref Range   WBC 15.6 (H) 4.0 - 10.5 K/uL   RBC 4.80 4.22 - 5.81 MIL/uL   Hemoglobin 14.5 13.0 - 17.0 g/dL   HCT 04.5 40.9 - 81.1 %   MCV 86.5 78.0 - 100.0 fL   MCH 30.2 26.0 - 34.0 pg   MCHC 34.9 30.0 - 36.0 g/dL   RDW 91.4 78.2 - 95.6 %   Platelets 231 150 - 400 K/uL   Neutrophils Relative % 83 (H) 43 - 77 %   Neutro Abs 13.0 (H) 1.7 - 7.7 K/uL   Lymphocytes Relative 7 (L) 12 - 46 %   Lymphs Abs 1.1 0.7 - 4.0 K/uL   Monocytes Relative 10 3 - 12 %    Monocytes Absolute 1.5 (H) 0.1 - 1.0 K/uL   Eosinophils Relative 0 0 - 5 %   Eosinophils Absolute 0.0 0.0 - 0.7 K/uL   Basophils Relative 0 0 - 1 %   Basophils Absolute 0.0 0.0 - 0.1 K/uL  Lactic acid, plasma  Result Value Ref Range   Lactic Acid, Venous 1.0 0.5 - 2.0 mmol/L   Ct Abdomen Pelvis W Contrast 11/15/2014   CLINICAL DATA:  Rectal pain 3 days.  EXAM: CT ABDOMEN AND PELVIS WITH CONTRAST  TECHNIQUE: Multidetector CT imaging of the abdomen and pelvis was performed using the standard protocol following bolus administration of intravenous contrast.  CONTRAST:  OMNIPAQUE IOHEXOL 300 MG/ML  SOLN  COMPARISON:  12/11/2012  FINDINGS: Lung bases demonstrate subtle scarring adjacent hole right lower lateral rib fractures. Small hiatal hernia.  Abdominal images demonstrate possible single tiny stone over the gallbladder fundus. Mild splenomegaly as the spleen measures 14 cm in greatest diameter.  The liver, pancreas and adrenal glands are within normal.  There findings suggesting a previous partial gastrectomy and gastrojejunostomy. Kidneys normal in size with a stable sub cm hypodensity over the mid pole of the left renal cortex too small to characterize but likely a cyst. Ureters are within normal. There is minimal calcified plaque of the abdominal aorta and iliac arteries. Appendix is normal. There is mild diverticulosis of the colon without active inflammation.  Pelvic images demonstrate a perianal abscess crossing midline to form a horseshoe abscess. This measures 2.6 x 5.6 cm in AP and transverse dimension. Remaining pelvic structures are within normal.  Mild compression deformity of L2 unchanged.  IMPRESSION: Perianal horseshoe abscess measuring 2.6 x 5.6 cm in AP and transverse dimension.  Mild stable splenomegaly.  Possible tiny single gallstone.  Sub cm left renal cortical hypodensity unchanged and too small to characterize but likely a cyst.  Diverticulosis of the colon.  Stable mild L2  compression fracture.  These results were called by telephone at the time of interpretation on 11/15/2014 at 12:47 pm to Dr. Samuel JesterKATHLEEN Jasiya Markie , who verbally acknowledged these results.   Electronically Signed   By: Elberta Fortisaniel  Boyle M.D.   On: 11/15/2014 12:43    1255:  IV abx started for perianal abscess.  New hyponatremia on labs; judicious IVF given. Hx etoh use, but no signs of withdrawal currently. Dx and testing d/w pt. Questions answered.  Verb understanding, agreeable to admit. T/C to General Surgery Dr. Lovell SheehanJenkins, case discussed, including:  HPI, pertinent PM/SHx, VS/PE, dx testing, ED course and treatment:  Agreeable to consult, requests to admit to medicine service, OK to eat today, he will not take pt to OR today due to hyponatremia.  1415:  T/C to [facility] Dr. Irene LimboGoodrich, case discussed, including:  HPI, pertinent PM/SHx, VS/PE, dx testing, ED course and treatment:  Agreeable to admit, requests to write temporary orders, obtain medical bed to team APAdmits.   Samuel JesterKathleen Isaiah Torok, DO 11/17/14 [id number]

## 2014-11-15 NOTE — ED Notes (Signed)
Oral constrast completed 

## 2014-11-15 NOTE — Progress Notes (Signed)
ANTIBIOTIC CONSULT NOTE  Pharmacy Consult for Cipro Indication: rectal abscess  No Known Allergies  Patient Measurements: Height: 5\' 8"  (172.7 cm) Weight: 138 lb 8 oz (62.823 kg) IBW/kg (Calculated) : 68.4  Vital Signs: Temp: 100.6 F (38.1 C) (05/28 1621) Temp Source: Oral (05/28 1621) BP: 128/74 mmHg (05/28 1621) Pulse Rate: 85 (05/28 1621) Intake/Output from previous day:   Intake/Output from this shift:    Labs:  Recent Labs  11/15/14 1038 11/15/14 1657 11/15/14 2004  WBC 15.6*  --   --   HGB 14.5  --   --   PLT 231  --   --   CREATININE 0.73 0.68 0.74   Estimated Creatinine Clearance: 86.1 mL/min (by C-G formula based on Cr of 0.74). No results for input(s): VANCOTROUGH, VANCOPEAK, VANCORANDOM, GENTTROUGH, GENTPEAK, GENTRANDOM, TOBRATROUGH, TOBRAPEAK, TOBRARND, AMIKACINPEAK, AMIKACINTROU, AMIKACIN in the last 72 hours.   Microbiology: No results found for this or any previous visit (from the past 720 hour(s)).  Anti-infectives    Start     Dose/Rate Route Frequency Ordered Stop   11/16/14 0230  ciprofloxacin (CIPRO) IVPB 400 mg  Status:  Discontinued     400 mg 200 mL/hr over 60 Minutes Intravenous Every 12 hours 11/15/14 1425 11/15/14 1613   11/15/14 2000  Ampicillin-Sulbactam (UNASYN) 3 g in sodium chloride 0.9 % 100 mL IVPB     3 g 100 mL/hr over 60 Minutes Intravenous Every 6 hours 11/15/14 1654     11/15/14 1300  metroNIDAZOLE (FLAGYL) IVPB 500 mg     500 mg 100 mL/hr over 60 Minutes Intravenous  Once 11/15/14 1253 11/15/14 1427   11/15/14 1300  ciprofloxacin (CIPRO) IVPB 400 mg     400 mg 200 mL/hr over 60 Minutes Intravenous  Once 11/15/14 1257 11/15/14 1452      Assessment: 64 yo M presents with rectal pain.  CT + perianal abscess.  He is afebrile, but WBC/neutophils elevated.  Excellent renal function. Cipro/Flagyl initiated in ED.  Goal of Therapy:  Eradicate infection.  Plan:  Cipro 400mg  IV q12h Flagyl per MD F/U patient's  ht/weight Monitor renal function and cx data   Elson ClanLilliston, Demaris Bousquet Michelle 11/15/2014,8:59 PM  Addendum:  Antibiotics changed to Unasyn 3gm IV q6h on admission.    Junita PushMichelle Ranesha Val, PharmD, BCPS 11/15/2014@9 :00 PM

## 2014-11-15 NOTE — Progress Notes (Signed)
ANTIBIOTIC CONSULT NOTE  Pharmacy Consult for Cipro Indication: rectal abscess  No Known Allergies  Patient Measurements:    Vital Signs: Temp: 98.3 F (36.8 C) (05/28 1000) Temp Source: Oral (05/28 1000) BP: 129/72 mmHg (05/28 1145) Pulse Rate: 96 (05/28 1145) Intake/Output from previous day:   Intake/Output from this shift:    Labs:  Recent Labs  11/15/14 1038  WBC 15.6*  HGB 14.5  PLT 231  CREATININE 0.73   CrCl cannot be calculated (Unknown ideal weight.). No results for input(s): VANCOTROUGH, VANCOPEAK, VANCORANDOM, GENTTROUGH, GENTPEAK, GENTRANDOM, TOBRATROUGH, TOBRAPEAK, TOBRARND, AMIKACINPEAK, AMIKACINTROU, AMIKACIN in the last 72 hours.   Microbiology: No results found for this or any previous visit (from the past 720 hour(s)).  Anti-infectives    Start     Dose/Rate Route Frequency Ordered Stop   11/15/14 1300  metroNIDAZOLE (FLAGYL) IVPB 500 mg     500 mg 100 mL/hr over 60 Minutes Intravenous  Once 11/15/14 1253     11/15/14 1300  ciprofloxacin (CIPRO) IVPB 400 mg     400 mg 200 mL/hr over 60 Minutes Intravenous  Once 11/15/14 1257        Assessment: [age] yo M presents with rectal pain.  CT + perianal abscess.  He is afebrile, but WBC/neutophils elevated.  Excellent renal function. Cipro/Flagyl initiated in ED.  Goal of Therapy:  Eradicate infection.  Plan:  Cipro 400mg  IV q12h Flagyl per MD F/U patient's ht/weight Monitor renal function and cx data   Elson ClanLilliston, Rashena Dowling Michelle 11/15/2014,2:15 PM

## 2014-11-15 NOTE — ED Notes (Signed)
Rectal  Pain times 3 days

## 2014-11-15 NOTE — ED Notes (Signed)
Report given to floor nurse, all questions answered  

## 2014-11-15 NOTE — H&P (Signed)
History and Physical  Consuello Masseommy L Hosman ONG:[id number] DOB: [birth year] DOA: 11/15/2014  Referring physician: Dr. Clarene DukeMcManus in ED PCP: Toma DeitersHASANAJ,XAJE A, MD   Chief Complaint: Bottom pain  HPI:  64 year old man presents the emergency department with three-day history of increasing rectal pain. He is found to have a perianal abscess and marked hyponatremia and referred for admission.  Patient denies any injury to the area and he has never had a similar problem. His symptoms began approximately 3 days ago and have become excruciating with severe pain in the rectal area. No specific aggravating or alleviating symptoms. He has not had any fever or systemic symptoms.   He does have a history of alcohol use but reports his last drink was several days ago when he does not drink very much.  In the emergency department afebrile, VSS, no hypoxia. Sodium 121, chloride 86, lactic acid normal. WBC 15.6, CBC otherwise unremarkable. Serum alcohol negative. Surgical consult was placed.  Review of Systems:  Negative for fever, visual changes, sore throat, rash, new muscle aches, chest pain, SOB, dysuria, bleeding, n/v/abdominal pain.  Past Medical History  Diagnosis Date  . PUD (peptic ulcer disease)     Past Surgical History  Procedure Laterality Date  . Back surgery    . Billroth ii      Social History:  reports that he has been smoking Cigarettes.  He does not have any smokeless tobacco history on file. He reports that he drinks alcohol. He reports that he does not use illicit drugs.   No Known Allergies  History reviewed. No pertinent family history. Patient denies any particular medical problems in the family.  Prior to Admission medications   Medication Sig Start Date End Date Taking? Authorizing Provider  gentamicin (GARAMYCIN) 0.3 % ophthalmic solution Place 1 drop into the right eye daily. 10/27/14  Yes Historical Provider, MD  Multiple Vitamin (MULTIVITAMIN) tablet Take 1 tablet by mouth  daily.   Yes Historical Provider, MD  naproxen (NAPROSYN) 500 MG tablet Take 500 mg by mouth 2 (two) times daily as needed. pain 09/18/14  Yes Historical Provider, MD  vitamin B-12 (CYANOCOBALAMIN) 1000 MCG tablet Take 1,000 mcg by mouth daily.   Yes Historical Provider, MD  cyclobenzaprine (FLEXERIL) 10 MG tablet Take 10 mg by mouth 3 (three) times daily as needed. Muscle spasm 09/18/14   Historical Provider, MD   Physical Exam: Filed Vitals:   11/15/14 1000 11/15/14 1045 11/15/14 1100 11/15/14 1145  BP: 118/92  105/63 129/72  Pulse: 113 87 85 96  Temp: 98.3 F (36.8 C)     TempSrc: Oral     Resp: 18   16  SpO2: 97% 97% 100% 100%    General:  Appears calm, mildly uncomfortable Eyes: Pupils, irises, lids appear unremarkable  ENT: grossly normal hearing, lips  Neck: There is grossly normal Cardiovascular: RRR, no m/r/g. No LE edema. Respiratory: CTA bilaterally, no w/r/r. Normal respiratory effort. Abdomen: soft, ntnd Skin: no rash or induration noted Musculoskeletal: grossly normal tone BUE/BLE Psychiatric: grossly normal mood and affect, speech fluent and appropriate Neurologic: grossly non-focal.  Wt Readings from Last 3 Encounters:  10/03/14 65.772 kg (145 lb)    Labs on Admission:  Basic Metabolic Panel:  Recent Labs Lab 11/15/14 1038  NA 121*  K 3.7  CL 86*  CO2 21*  GLUCOSE 106*  BUN 7  CREATININE 0.73  CALCIUM 8.9  MG 1.7    Liver Function Tests:  Recent Labs Lab 11/15/14 1038  AST 24  ALT 20  ALKPHOS 117  BILITOT 1.1  PROT 7.9  ALBUMIN 3.7   CBC:  Recent Labs Lab 11/15/14 1038  WBC 15.6*  NEUTROABS 13.0*  HGB 14.5  HCT 41.5  MCV 86.5  PLT 231    Radiological Exams on Admission: Ct Abdomen Pelvis W Contrast  11/15/2014   CLINICAL DATA:  Rectal pain 3 days.  EXAM: CT ABDOMEN AND PELVIS WITH CONTRAST  TECHNIQUE: Multidetector CT imaging of the abdomen and pelvis was performed using the standard protocol following bolus administration of  intravenous contrast.  CONTRAST:  OMNIPAQUE IOHEXOL 300 MG/ML  SOLN  COMPARISON:  12/11/2012  FINDINGS: Lung bases demonstrate subtle scarring adjacent hole right lower lateral rib fractures. Small hiatal hernia.  Abdominal images demonstrate possible single tiny stone over the gallbladder fundus. Mild splenomegaly as the spleen measures 14 cm in greatest diameter.  The liver, pancreas and adrenal glands are within normal. There findings suggesting a previous partial gastrectomy and gastrojejunostomy. Kidneys normal in size with a stable sub cm hypodensity over the mid pole of the left renal cortex too small to characterize but likely a cyst. Ureters are within normal. There is minimal calcified plaque of the abdominal aorta and iliac arteries. Appendix is normal. There is mild diverticulosis of the colon without active inflammation.  Pelvic images demonstrate a perianal abscess crossing midline to form a horseshoe abscess. This measures 2.6 x 5.6 cm in AP and transverse dimension. Remaining pelvic structures are within normal.  Mild compression deformity of L2 unchanged.  IMPRESSION: Perianal horseshoe abscess measuring 2.6 x 5.6 cm in AP and transverse dimension.  Mild stable splenomegaly.  Possible tiny single gallstone.  Sub cm left renal cortical hypodensity unchanged and too small to characterize but likely a cyst.  Diverticulosis of the colon.  Stable mild L2 compression fracture.  These results were called by telephone at the time of interpretation on 11/15/2014 at 12:47 pm to Dr. Samuel Jester , who verbally acknowledged these results.   Electronically Signed   By: Elberta Fortis M.D.   On: 11/15/2014 12:43    EKG: not indicated   Principal Problem:   Perianal abscess Active Problems:   Hyponatremia   Alcohol abuse   Polysubstance abuse   Assessment/Plan 1. Perianal abscess. 2. Hyponatremia. Suspect hypovolemic, "beer drinkers" potomania. Asymptomatic. 3. Alcohol abuse. No evidence  of which are all at this point. 4. Polysubstance abuse according to chart. 5. 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 Tobacco dependence   Patient appears stable. Plan admission to medical floor; anticipate surgery once sodium has improved.  Empiric antibiotics. Surgical consultation. Nothing by mouth after midnight.  Suspect dehydration and poor solute intake is underlying etiology versus hyponatremia. Plan saline and follow sodium closely.  CIWA  Urine drug screen  Code Status: full code  DVT prophylaxis: SCDs Family Communication: none. Patient alert and understands plan. Disposition Plan/Anticipated LOS: admit, 2 days  Time spent: 50 minutes  Brendia Sacks, MD  [facility] Pager [phone] 11/15/2014, 2:09 PM

## 2014-11-16 LAB — BASIC METABOLIC PANEL
ANION GAP: 9 (ref 5–15)
Anion gap: 9 (ref 5–15)
Anion gap: 9 (ref 5–15)
BUN: 6 mg/dL (ref 6–20)
BUN: 6 mg/dL (ref 6–20)
BUN: 5 mg/dL — ABNORMAL LOW (ref 6–20)
CHLORIDE: 94 mmol/L — AB (ref 101–111)
CHLORIDE: 92 mmol/L — AB (ref 101–111)
CO2: 22 mmol/L (ref 22–32)
CO2: 24 mmol/L (ref 22–32)
CO2: 27 mmol/L (ref 22–32)
CREATININE: 0.69 mg/dL (ref 0.61–1.24)
Calcium: 7.9 mg/dL — ABNORMAL LOW (ref 8.9–10.3)
Calcium: 8 mg/dL — ABNORMAL LOW (ref 8.9–10.3)
Calcium: 8.1 mg/dL — ABNORMAL LOW (ref 8.9–10.3)
Chloride: 93 mmol/L — ABNORMAL LOW (ref 101–111)
Creatinine, Ser: 0.65 mg/dL (ref 0.61–1.24)
Creatinine, Ser: 0.64 mg/dL (ref 0.61–1.24)
GFR calc Af Amer: >60 mL/min (ref >60)
GFR calc non Af Amer: >60 mL/min (ref >60)
Glucose, Bld: 144 mg/dL — ABNORMAL HIGH (ref 65–99)
Glucose, Bld: 103 mg/dL — ABNORMAL HIGH (ref 65–99)
Glucose, Bld: 102 mg/dL — ABNORMAL HIGH (ref 65–99)
POTASSIUM: 3.7 mmol/L (ref 3.5–5.1)
POTASSIUM: 3.2 mmol/L — AB (ref 3.5–5.1)
POTASSIUM: 3.4 mmol/L — AB (ref 3.5–5.1)
SODIUM: 127 mmol/L — AB (ref 135–145)
Sodium: 124 mmol/L — ABNORMAL LOW (ref 135–145)
Sodium: 128 mmol/L — ABNORMAL LOW (ref 135–145)

## 2014-11-16 LAB — CBC
HCT: 34.9 % — ABNORMAL LOW (ref 39.0–52.0)
Hemoglobin: 11.7 g/dL — ABNORMAL LOW (ref 13.0–17.0)
MCH: 29.6 pg (ref 26.0–34.0)
MCHC: 33.5 g/dL (ref 30.0–36.0)
MCV: 88.4 fL (ref 78.0–100.0)
Platelets: 163 10*3/uL (ref 150–400)
RBC: 3.95 MIL/uL — AB (ref 4.22–5.81)
RDW: 13.9 % (ref 11.5–15.5)
WBC: 8.9 10*3/uL (ref 4.0–10.5)

## 2014-11-16 LAB — RAPID URINE DRUG SCREEN, HOSP PERFORMED
Amphetamines: NOT DETECTED
Barbiturates: NOT DETECTED
Benzodiazepines: NOT DETECTED
Cocaine: NOT DETECTED
Opiates: POSITIVE — AB
Tetrahydrocannabinol: NOT DETECTED

## 2014-11-16 MED ORDER — POTASSIUM CHLORIDE CRYS ER 20 MEQ PO TBCR
40.0000 meq | EXTENDED_RELEASE_TABLET | Freq: Once | ORAL | Status: AC
  Administered 2014-11-16: 40 meq via ORAL
  Filled 2014-11-16: qty 2

## 2014-11-16 MED ORDER — AMOXICILLIN-POT CLAVULANATE 875-125 MG PO TABS
1.0000 | ORAL_TABLET | Freq: Two times a day (BID) | ORAL | Status: DC
  Administered 2014-11-16 – 2014-11-18 (×4): 1 via ORAL
  Filled 2014-11-16 (×4): qty 1

## 2014-11-16 NOTE — Progress Notes (Signed)
  PROGRESS NOTE  Joseph Townsend VHQ:[id number] DOB: 1951/06/05 DOA: 11/15/2014 PCP: Toma DeitersHASANAJ,XAJE A, MD  Summary: [age] year old man presents the emergency department with three-day history of increasing rectal pain. He is found to have a perianal abscess and marked hyponatremia and referred for admission.  Assessment/Plan: 1. Perianal abscess. Status post bedside evacuation. 2. 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 Improving. Asymptomatic. 3. Alcohol abuse. No evidence of withdrawal. 4. 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 Tobacco dependence.   Overall improving. Perianal abscess was evacuated. Continue antibiotics. Change to oral therapy.  Hyponatremia improving. Plan to continue IV fluids and check basic metabolic panel in the morning.  Anticipate discharge 5/30.  Code Status: full code DVT prophylaxis: SCDs Family Communication: none present Disposition Plan: home  Brendia Sacksaniel Bina Veenstra, MD  [facility]  Pager [phone] If 7PM-7AM, please contact night-coverage at [email or web address], password [email or web address] 11/16/2014, 2:59 PM  LOS: 1 day   Consultants:  General surgery  Procedures:    Antibiotics:  Unasyn 5/28 >>  HPI/Subjective: Feeling better. Less pain.  Objective: Filed Vitals:   11/15/14 2300 11/16/14 0205 11/16/14 0602 11/16/14 0627  BP: 118/67 97/50 100/75 102/64  Pulse: 87 78 92 82  Temp:  100.1 F (37.8 C) 99.8 F (37.7 C)   TempSrc:  Oral Oral   Resp:  20 18   Height:      Weight:      SpO2:  97% 98%    No intake or output data in the 24 hours ending 11/16/14 1459   Filed Weights   11/15/14 1621  Weight: 62.823 kg (138 lb 8 oz)    Exam:     Afebrile, vital signs stable. No hypoxia. General:  Appears calm and comfortable Cardiovascular: RRR, no m/r/g. Respiratory: CTA bilaterally, no w/r/r. Normal respiratory effort. Psychiatric: grossly normal mood and affect, speech fluent and appropriate  New  data reviewed:  Sitting slowly improving, up to 128. Chloride improving.  Leukocytosis has resolved.  Hemoglobin decreased, 11.7, likely related to fluids.  Pertinent data since admission:    Pending data:    Scheduled Meds: . ampicillin-sulbactam (UNASYN) IV  3 g Intravenous Q6H  . folic acid  1 mg Oral Daily  . multivitamin with minerals  1 tablet Oral Daily  . thiamine  100 mg Oral Daily   Or  . thiamine  100 mg Intravenous Daily  . tobramycin  1 drop Right Eye Daily   Continuous Infusions: . sodium chloride 75 mL/hr at 11/16/14 [id number]    Principal Problem:   Perianal abscess Active Problems:   Hyponatremia   Alcohol abuse   Polysubstance abuse   Time spent 15 minutes

## 2014-11-16 NOTE — Consult Note (Signed)
Reason for Consult: Perirectal abscess Referring Physician: Hospitalist  Joseph Townsend is an 64 y.o. male.  HPI: Patient is a 64 year old white male who presented to emergency room with worsening rectal pain. He was found on CT scan of the abdomen and pelvis to have a perirectal abscess. Patient does not know whether he is had any drainage. He denies any fevers. He was admitted to the hospital for further evaluation treatment. Patient is a poor historian.  Past Medical History  Diagnosis Date  . PUD (peptic ulcer disease)     Past Surgical History  Procedure Laterality Date  . Back surgery    . Billroth ii      History reviewed. No pertinent family history.  Social History:  reports that he has been smoking Cigarettes.  He has been smoking about 1.00 pack per day. He does not have any smokeless tobacco history on file. He reports that he drinks alcohol. He reports that he does not use illicit drugs.  Allergies: No Known Allergies  Medications: I have reviewed the patient's current medications.  Results for orders placed or performed during the hospital encounter of 11/15/14 (from the past 48 hour(s))  Basic metabolic panel     Status: Abnormal   Collection Time: 11/15/14 10:38 AM  Result Value Ref Range   Sodium 121 (L) 135 - 145 mmol/L   Potassium 3.7 3.5 - 5.1 mmol/L   Chloride 86 (L) 101 - 111 mmol/L   CO2 21 (L) 22 - 32 mmol/L   Glucose, Bld 106 (H) 65 - 99 mg/dL   BUN 7 6 - 20 mg/dL   Creatinine, Ser 0.73 0.61 - 1.24 mg/dL   Calcium 8.9 8.9 - 10.3 mg/dL   GFR calc non Af Amer >60 >60 mL/min   GFR calc Af Amer >60 >60 mL/min    Comment: (NOTE) The eGFR has been calculated using the CKD EPI equation. This calculation has not been validated in all clinical situations. eGFR's persistently <60 mL/min signify possible Chronic Kidney Disease.    Anion gap 14 5 - 15  CBC with Differential     Status: Abnormal   Collection Time: 11/15/14 10:38 AM  Result Value Ref Range    WBC 15.6 (H) 4.0 - 10.5 K/uL   RBC 4.80 4.22 - 5.81 MIL/uL   Hemoglobin 14.5 13.0 - 17.0 g/dL   HCT 41.5 39.0 - 52.0 %   MCV 86.5 78.0 - 100.0 fL   MCH 30.2 26.0 - 34.0 pg   MCHC 34.9 30.0 - 36.0 g/dL   RDW 13.7 11.5 - 15.5 %   Platelets 231 150 - 400 K/uL   Neutrophils Relative % 83 (H) 43 - 77 %   Neutro Abs 13.0 (H) 1.7 - 7.7 K/uL   Lymphocytes Relative 7 (L) 12 - 46 %   Lymphs Abs 1.1 0.7 - 4.0 K/uL   Monocytes Relative 10 3 - 12 %   Monocytes Absolute 1.5 (H) 0.1 - 1.0 K/uL   Eosinophils Relative 0 0 - 5 %   Eosinophils Absolute 0.0 0.0 - 0.7 K/uL   Basophils Relative 0 0 - 1 %   Basophils Absolute 0.0 0.0 - 0.1 K/uL  Ethanol     Status: None   Collection Time: 11/15/14 10:38 AM  Result Value Ref Range   Alcohol, Ethyl (B) <5 <5 mg/dL    Comment:        LOWEST DETECTABLE LIMIT FOR SERUM ALCOHOL IS 11 mg/dL FOR MEDICAL PURPOSES ONLY  Hepatic function panel     Status: None   Collection Time: 11/15/14 10:38 AM  Result Value Ref Range   Total Protein 7.9 6.5 - 8.1 g/dL   Albumin 3.7 3.5 - 5.0 g/dL   AST 24 15 - 41 U/L   ALT 20 17 - 63 U/L   Alkaline Phosphatase 117 38 - 126 U/L   Total Bilirubin 1.1 0.3 - 1.2 mg/dL   Bilirubin, Direct 0.3 0.1 - 0.5 mg/dL   Indirect Bilirubin 0.8 0.3 - 0.9 mg/dL  Protime-INR     Status: None   Collection Time: 11/15/14 10:38 AM  Result Value Ref Range   Prothrombin Time 14.6 11.6 - 15.2 seconds   INR 1.12 0.00 - 1.49  Magnesium     Status: None   Collection Time: 11/15/14 10:38 AM  Result Value Ref Range   Magnesium 1.7 1.7 - 2.4 mg/dL  Lactic acid, plasma     Status: None   Collection Time: 11/15/14 10:43 AM  Result Value Ref Range   Lactic Acid, Venous 1.0 0.5 - 2.0 mmol/L  Lactic acid, plasma     Status: None   Collection Time: 11/15/14  1:26 PM  Result Value Ref Range   Lactic Acid, Venous 1.0 0.5 - 2.0 mmol/L  Basic metabolic panel     Status: Abnormal   Collection Time: 11/15/14  4:57 PM  Result Value Ref Range    Sodium 125 (L) 135 - 145 mmol/L   Potassium 3.6 3.5 - 5.1 mmol/L   Chloride 91 (L) 101 - 111 mmol/L   CO2 24 22 - 32 mmol/L   Glucose, Bld 123 (H) 65 - 99 mg/dL   BUN 6 6 - 20 mg/dL   Creatinine, Ser 0.68 0.61 - 1.24 mg/dL   Calcium 8.3 (L) 8.9 - 10.3 mg/dL   GFR calc non Af Amer >60 >60 mL/min   GFR calc Af Amer >60 >60 mL/min    Comment: (NOTE) The eGFR has been calculated using the CKD EPI equation. This calculation has not been validated in all clinical situations. eGFR's persistently <60 mL/min signify possible Chronic Kidney Disease.    Anion gap 10 5 - 15  Basic metabolic panel     Status: Abnormal   Collection Time: 11/15/14  8:04 PM  Result Value Ref Range   Sodium 125 (L) 135 - 145 mmol/L   Potassium 3.6 3.5 - 5.1 mmol/L   Chloride 93 (L) 101 - 111 mmol/L   CO2 24 22 - 32 mmol/L   Glucose, Bld 90 65 - 99 mg/dL   BUN 7 6 - 20 mg/dL   Creatinine, Ser 0.74 0.61 - 1.24 mg/dL   Calcium 8.1 (L) 8.9 - 10.3 mg/dL   GFR calc non Af Amer >60 >60 mL/min   GFR calc Af Amer >60 >60 mL/min    Comment: (NOTE) The eGFR has been calculated using the CKD EPI equation. This calculation has not been validated in all clinical situations. eGFR's persistently <60 mL/min signify possible Chronic Kidney Disease.    Anion gap 8 5 - 15  Basic metabolic panel     Status: Abnormal   Collection Time: 11/16/14 12:54 AM  Result Value Ref Range   Sodium 124 (L) 135 - 145 mmol/L   Potassium 3.2 (L) 3.5 - 5.1 mmol/L   Chloride 93 (L) 101 - 111 mmol/L   CO2 22 22 - 32 mmol/L   Glucose, Bld 144 (H) 65 - 99 mg/dL   BUN 6  6 - 20 mg/dL   Creatinine, Ser 0.65 0.61 - 1.24 mg/dL   Calcium 7.9 (L) 8.9 - 10.3 mg/dL   GFR calc non Af Amer >60 >60 mL/min   GFR calc Af Amer >60 >60 mL/min    Comment: (NOTE) The eGFR has been calculated using the CKD EPI equation. This calculation has not been validated in all clinical situations. eGFR's persistently <60 mL/min signify possible Chronic  Kidney Disease.    Anion gap 9 5 - 15  Urine rapid drug screen (hosp performed)     Status: Abnormal   Collection Time: 11/16/14  2:14 AM  Result Value Ref Range   Opiates POSITIVE (A) NONE DETECTED   Cocaine NONE DETECTED NONE DETECTED   Benzodiazepines NONE DETECTED NONE DETECTED   Amphetamines NONE DETECTED NONE DETECTED   Tetrahydrocannabinol NONE DETECTED NONE DETECTED   Barbiturates NONE DETECTED NONE DETECTED    Comment:        DRUG SCREEN FOR MEDICAL PURPOSES ONLY.  IF CONFIRMATION IS NEEDED FOR ANY PURPOSE, NOTIFY LAB WITHIN 5 DAYS.        LOWEST DETECTABLE LIMITS FOR URINE DRUG SCREEN Drug Class       Cutoff (ng/mL) Amphetamine      1000 Barbiturate      200 Benzodiazepine   354 Tricyclics       656 Opiates          300 Cocaine          300 THC              50   Basic metabolic panel     Status: Abnormal   Collection Time: 11/16/14  5:18 AM  Result Value Ref Range   Sodium 127 (L) 135 - 145 mmol/L   Potassium 3.4 (L) 3.5 - 5.1 mmol/L   Chloride 94 (L) 101 - 111 mmol/L   CO2 24 22 - 32 mmol/L   Glucose, Bld 103 (H) 65 - 99 mg/dL   BUN 6 6 - 20 mg/dL   Creatinine, Ser 0.64 0.61 - 1.24 mg/dL   Calcium 8.0 (L) 8.9 - 10.3 mg/dL   GFR calc non Af Amer >60 >60 mL/min   GFR calc Af Amer >60 >60 mL/min    Comment: (NOTE) The eGFR has been calculated using the CKD EPI equation. This calculation has not been validated in all clinical situations. eGFR's persistently <60 mL/min signify possible Chronic Kidney Disease.    Anion gap 9 5 - 15    Ct Abdomen Pelvis W Contrast  11/15/2014   CLINICAL DATA:  Rectal pain 3 days.  EXAM: CT ABDOMEN AND PELVIS WITH CONTRAST  TECHNIQUE: Multidetector CT imaging of the abdomen and pelvis was performed using the standard protocol following bolus administration of intravenous contrast.  CONTRAST:  132m OMNIPAQUE IOHEXOL 300 MG/ML  SOLN  COMPARISON:  12/11/2012  FINDINGS: Lung bases demonstrate subtle scarring adjacent hole right  lower lateral rib fractures. Small hiatal hernia.  Abdominal images demonstrate possible single tiny stone over the gallbladder fundus. Mild splenomegaly as the spleen measures 14 cm in greatest diameter.  The liver, pancreas and adrenal glands are within normal. There findings suggesting a previous partial gastrectomy and gastrojejunostomy. Kidneys normal in size with a stable sub cm hypodensity over the mid pole of the left renal cortex too small to characterize but likely a cyst. Ureters are within normal. There is minimal calcified plaque of the abdominal aorta and iliac arteries. Appendix is normal. There is mild diverticulosis  of the colon without active inflammation.  Pelvic images demonstrate a perianal abscess crossing midline to form a horseshoe abscess. This measures 2.6 x 5.6 cm in AP and transverse dimension. Remaining pelvic structures are within normal.  Mild compression deformity of L2 unchanged.  IMPRESSION: Perianal horseshoe abscess measuring 2.6 x 5.6 cm in AP and transverse dimension.  Mild stable splenomegaly.  Possible tiny single gallstone.  Sub cm left renal cortical hypodensity unchanged and too small to characterize but likely a cyst.  Diverticulosis of the colon.  Stable mild L2 compression fracture.  These results were called by telephone at the time of interpretation on 11/15/2014 at 12:47 pm to Dr. Francine Graven , who verbally acknowledged these results.   Electronically Signed   By: Marin Olp M.D.   On: 11/15/2014 12:43    ROS: See chart Blood pressure 102/64, pulse 82, temperature 99.8 F (37.7 C), temperature source Oral, resp. rate 18, height 5' 8"  (1.727 m), weight 62.823 kg (138 lb 8 oz), SpO2 98 %. Physical Exam: Disheveled white male in no acute distress. On rectal examination, fullness was noted along the dentate line posteriorly. Digitally, I was able to evacuate the perirectal abscess. A significant amount of purulent drainage was drained. This was proximal to  the external sphincter mechanism.  Assessment/Plan: Impression: Perirectal abscess Plan: No need for further surgical intervention at this time. Should the patient be discharged, he should have a 10 day course of ciprofloxacin and Flagyl. I will see him in my office in one week.  Melyssa Signor A 11/16/2014, 9:33 AM

## 2014-11-17 DIAGNOSIS — E440 Moderate protein-calorie malnutrition: Secondary | ICD-10-CM | POA: Insufficient documentation

## 2014-11-17 LAB — BASIC METABOLIC PANEL
Anion gap: 9 (ref 5–15)
BUN: <5 mg/dL — ABNORMAL LOW (ref 6–20)
CALCIUM: 7.9 mg/dL — AB (ref 8.9–10.3)
CHLORIDE: 93 mmol/L — AB (ref 101–111)
CO2: 24 mmol/L (ref 22–32)
CREATININE: 0.57 mg/dL — AB (ref 0.61–1.24)
GFR calc non Af Amer: >60 mL/min (ref >60)
Glucose, Bld: 113 mg/dL — ABNORMAL HIGH (ref 65–99)
POTASSIUM: 3.5 mmol/L (ref 3.5–5.1)
SODIUM: 126 mmol/L — AB (ref 135–145)

## 2014-11-17 MED ORDER — ENSURE ENLIVE PO LIQD
237.0000 mL | Freq: Two times a day (BID) | ORAL | Status: DC
  Administered 2014-11-17 – 2014-11-18 (×3): 237 mL via ORAL

## 2014-11-17 MED ORDER — SODIUM CHLORIDE 0.9 % IV SOLN
INTRAVENOUS | Status: DC
  Administered 2014-11-17 – 2014-11-18 (×2): via INTRAVENOUS

## 2014-11-17 NOTE — Progress Notes (Addendum)
  PROGRESS NOTE  Joseph Townsend [place] ZOX:[id number] DOB: 25-Jan-1951 DOA: 11/15/2014 PCP: Toma DeitersHASANAJ,XAJE A, MD  Summary: 64 year old man presents the emergency department with three-day history of increasing rectal pain. He is found to have a perianal abscess and marked hyponatremia and referred for admission.  Assessment/Plan: 1. 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 Slow to improve likely as I/O close. Continue IVF. Asymptomatic. 2. Perianal abscess. Status post bedside evacuation by general surgery. Improving, continue abx. F/u with general surgery Dr. Lovell SheehanJenkins as an outpatient in 1 week. 3. Alcohol abuse. No evidence of withdrawal. 4. 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 Tobacco dependence. 6. Moderate malnutrition.   Overall improved but sodium still low  Increase IVF, recheck BMP in AM. Fluid restriction.  Likely home in AM if sodium improved.  Code Status: full code DVT prophylaxis: SCDs Family Communication: none present Disposition Plan: home  Brendia Sacksaniel Janvi Ammar, MD  [facility]  Pager [phone] If 7PM-7AM, please contact night-coverage at [email or web address], password [email or web address] 11/17/2014, 12:18 PM  LOS: 2 days   Consultants:  General surgery  Procedures:  Bedside evacuation of perineal abscess  Antibiotics:  Unasyn 5/28 >> 5/29  Augmentin 5/29 >>  HPI/Subjective: Better, pain much better controlled.  Objective: Filed Vitals:   11/16/14 0627 11/16/14 1707 11/16/14 2238 11/17/14 0641  BP: 102/64 135/61 110/62 113/61  Pulse: 82 83 85 100  Temp:  98.6 F (37 C) 99.8 F (37.7 C) 97.8 F (36.6 C)  TempSrc:  Oral Oral Oral  Resp:  18 18 18   Height:      Weight:      SpO2:  100% 97% 100%    Intake/Output Summary (Last 24 hours) at 11/17/14 1218 Last data filed at 11/17/14 0824  Gross per 24 hour  Intake 3681.25 ml  Output   2250 ml  Net 1431.25 ml     Filed Weights   11/15/14 1621  Weight: 62.823 kg (138 lb  8 oz)    Exam:    Afebrile, VSS General:  Appears comfortable, calm. Cardiovascular: Regular rate and rhythm, no murmur, rub or gallop. No lower extremity edema. Respiratory: Clear to auscultation bilaterally, no wheezes, rales or rhonchi. Normal respiratory effort. Psychiatric: grossly normal mood and affect, speech fluent and appropriate  New data reviewed:  UOP 2000  +1.3 liters since admission  Sodium without significant change, 126  Pertinent data since admission:    Pending data:    Scheduled Meds: . amoxicillin-clavulanate  1 tablet Oral Q12H  . feeding supplement (ENSURE ENLIVE)  237 mL Oral BID BM  . folic acid  1 mg Oral Daily  . multivitamin with minerals  1 tablet Oral Daily  . thiamine  100 mg Oral Daily   Or  . thiamine  100 mg Intravenous Daily  . tobramycin  1 drop Right Eye Daily   Continuous Infusions: . sodium chloride 150 mL/hr at 11/17/14 0825    Principal Problem:   Hyponatremia Active Problems:   Perianal abscess   Alcohol abuse   Polysubstance abuse   Malnutrition of moderate degree   Time spent 15 minutes

## 2014-11-17 NOTE — Progress Notes (Signed)
Initial Nutrition Assessment  DOCUMENTATION CODES:  Non-severe (moderate) malnutrition in context of social or environmental circumstances  INTERVENTION:  Ensure Enlive (each supplement provides 350kcal and 20 grams of protein)  NUTRITION DIAGNOSIS:  Inadequate oral intake related to inability to eat as evidenced by per patient/family report.   GOAL:  Patient will meet greater than or equal to 90% of their needs   MONITOR:  PO intake, Labs  REASON FOR ASSESSMENT:  Malnutrition Screening Tool    ASSESSMENT: pt presents with perianal abscess. Hyponatremia (suspected beer drinker's potomania). He also smokes 1 pack/day. Increased weakness on admssion per pt and he has been nauseated and vomiting 3 days prior to admission.  This morning he's feeling better and appetite has improved. Diet is fully advanced and po's 75% of breakfast this morning. His weight is down 4% in the past month which is trending toward significant and he has multiple areas of muscle wasting and fat mass depletion.  Pt says he may be d/c later today. Encouraged general healthy diet with consistent oral intake. He takes MVI and b-complex at home. Recommend he add oral nutrition supplement between meals BID with discharge instructions.  Height:  Ht Readings from Last 1 Encounters:  11/15/14 5\' 8"  (1.727 m)    Weight:  Wt Readings from Last 1 Encounters:  11/15/14 138 lb 8 oz (62.823 kg)    Ideal Body Weight:  70 kg  Wt Readings from Last 10 Encounters:  11/15/14 138 lb 8 oz (62.823 kg)  10/03/14 145 lb (65.772 kg)    BMI:  Body mass index is 21.06 kg/(m^2).  Estimated Nutritional Needs:  Kcal:  [id number]  Protein:  75-85 gr  Fluid:  1800 fluid restriction  Skin:    peri-renal abscess  Diet Order:  Diet regular Room service appropriate?: Yes; Fluid consistency:: Thin; Fluid restriction:: 1800 mL Fluid  EDUCATION NEEDS:    Intake/Output Summary (Last 24 hours) at 11/17/14 1055 Last  data filed at 11/17/14 0824  Gross per 24 hour  Intake 3681.25 ml  Output   2250 ml  Net 1431.25 ml    Last BM:  5/28  Royann ShiversLynn Nyashia Raney MS,RD,CSG,LDN Office: [phone] Pager: [phone]

## 2014-11-18 DIAGNOSIS — F1010 Alcohol abuse, uncomplicated: Secondary | ICD-10-CM

## 2014-11-18 DIAGNOSIS — K610 Anal abscess: Principal | ICD-10-CM

## 2014-11-18 DIAGNOSIS — E871 Hypo-osmolality and hyponatremia: Secondary | ICD-10-CM

## 2014-11-18 DIAGNOSIS — E440 Moderate protein-calorie malnutrition: Secondary | ICD-10-CM

## 2014-11-18 LAB — BASIC METABOLIC PANEL
Anion gap: 8 (ref 5–15)
BUN: <5 mg/dL — ABNORMAL LOW (ref 6–20)
CHLORIDE: 98 mmol/L — AB (ref 101–111)
CO2: 25 mmol/L (ref 22–32)
CREATININE: 0.51 mg/dL — AB (ref 0.61–1.24)
Calcium: 8.1 mg/dL — ABNORMAL LOW (ref 8.9–10.3)
GFR calc non Af Amer: >60 mL/min (ref >60)
Glucose, Bld: 98 mg/dL (ref 65–99)
POTASSIUM: 3.5 mmol/L (ref 3.5–5.1)
Sodium: 131 mmol/L — ABNORMAL LOW (ref 135–145)

## 2014-11-18 MED ORDER — METRONIDAZOLE 500 MG PO TABS: 500.0000 mg | ORAL_TABLET | Freq: Three times a day (TID) | ORAL | Status: DC

## 2014-11-18 MED ORDER — THIAMINE HCL 100 MG PO TABS: 100.0000 mg | ORAL_TABLET | Freq: Every day | ORAL | Status: AC

## 2014-11-18 MED ORDER — HYDROCODONE-ACETAMINOPHEN 5-325 MG PO TABS: 1.0000 | ORAL_TABLET | ORAL | Status: DC | PRN

## 2014-11-18 MED ORDER — CIPROFLOXACIN HCL 500 MG PO TABS: 500.0000 mg | ORAL_TABLET | Freq: Two times a day (BID) | ORAL | Status: DC

## 2014-11-18 NOTE — Progress Notes (Signed)
IV removed. Discharge instructions reviewed with patient. Understanding verbalized. Taken out via wheelchair for discharge home. 

## 2014-11-18 NOTE — Discharge Summary (Signed)
Physician Discharge Summary  CARLEN FILS ZOX:[id number] DOB: [birth year] DOA: 11/15/2014  PCP: Toma Deiters, MD  Admit date: 11/15/2014 Discharge date: 11/18/2014  Time spent: *35 minutes  Recommendations for Outpatient Follow-up:  1. Follow up Dr Lovell Sheehan in one week  Discharge Diagnoses:  Principal Problem:   Hyponatremia Active Problems:   Perianal abscess   Alcohol abuse   Polysubstance abuse   Malnutrition of moderate degree   Discharge Condition: Stable  Diet recommendation: Regular diet  Filed Weights   11/15/14 1621  Weight: 62.823 kg (138 lb 8 oz)    History of present illness:  64 year old man presents the emergency department with three-day history of increasing rectal pain. He is found to have a perianal abscess and marked hyponatremia and referred for admission.  Patient denies any injury to the area and he has never had a similar problem. His symptoms began approximately 3 days ago and have become excruciating with severe pain in the rectal area. No specific aggravating or alleviating symptoms. He has not had any fever or systemic symptoms.   Hospital Course:  1. 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 Sodium improved after fluid restriction. Today sodium is 131. 2. Perianal abscess. Status post bedside evacuation by general surgery. Will be discharged on Cipro and Flagyl for 10 more days as per general surgery recommendations. Patient will follow-up with Dr. Lovell Sheehan in one week. 3. Alcohol abuse. No evidence of withdrawal. Continue thiamine 100 mg by mouth daily. 4. 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 surgery  Discharge Exam: Filed Vitals:   11/18/14 0604  BP: 108/65  Pulse: 79  Temp: 98.7 F (37.1 C)  Resp: 20    General: Appears in no acute distress Cardiovascular: S1-S2 is regular Respiratory: Clear to  auscultation bilaterally  Discharge Instructions   Discharge Instructions    Diet - low sodium heart healthy    Complete by:  As directed      Increase activity slowly    Complete by:  As directed           Current Discharge Medication List    START taking these medications   Details  ciprofloxacin (CIPRO) 500 MG tablet Take 1 tablet (500 mg total) by mouth 2 (two) times daily. Qty: 20 tablet, Refills: 0    HYDROcodone-acetaminophen (NORCO/VICODIN) 5-325 MG per tablet Take 1-2 tablets by mouth every 4 (four) hours as needed for moderate pain. Qty: 30 tablet, Refills: 0    metroNIDAZOLE (FLAGYL) 500 MG tablet Take 1 tablet (500 mg total) by mouth 3 (three) times daily. Qty: 30 tablet, Refills: 0    thiamine 100 MG tablet Take 1 tablet (100 mg total) by mouth daily. Qty: 30 tablet, Refills: 0      CONTINUE these medications which have NOT CHANGED   Details  gentamicin (GARAMYCIN) 0.3 % ophthalmic solution Place 1 drop into the right eye daily. Refills: 0    Multiple Vitamin (MULTIVITAMIN) tablet Take 1 tablet by mouth daily.    naproxen (NAPROSYN) 500 MG tablet Take 500 mg by mouth 2 (two) times daily as needed. pain Refills: 0    vitamin B-12 (CYANOCOBALAMIN) 1000 MCG tablet Take 1,000 mcg by mouth daily.    cyclobenzaprine (FLEXERIL) 10 MG tablet Take 10 mg by mouth 3 (three) times daily as needed. Muscle spasm Refills: 2       No Known Allergies Follow-up Information    Follow  up with Dalia Heading, MD. Call on 11/27/2014.   Specialty:  General Surgery   Contact information:   [address] [place] [address] [phone]        The results of significant diagnostics from this hospitalization (including imaging, microbiology, ancillary and laboratory) are listed below for reference.    Significant Diagnostic Studies: Ct Abdomen Pelvis W Contrast  11/15/2014   CLINICAL DATA:  Rectal pain 3 days.  EXAM: CT ABDOMEN AND PELVIS WITH CONTRAST   TECHNIQUE: Multidetector CT imaging of the abdomen and pelvis was performed using the standard protocol following bolus administration of intravenous contrast.  CONTRAST:  OMNIPAQUE IOHEXOL 300 MG/ML  SOLN  COMPARISON:  12/11/2012  FINDINGS: Lung bases demonstrate subtle scarring adjacent hole right lower lateral rib fractures. Small hiatal hernia.  Abdominal images demonstrate possible single tiny stone over the gallbladder fundus. Mild splenomegaly as the spleen measures 14 cm in greatest diameter.  The liver, pancreas and adrenal glands are within normal. There findings suggesting a previous partial gastrectomy and gastrojejunostomy. Kidneys normal in size with a stable sub cm hypodensity over the mid pole of the left renal cortex too small to characterize but likely a cyst. Ureters are within normal. There is minimal calcified plaque of the abdominal aorta and iliac arteries. Appendix is normal. There is mild diverticulosis of the colon without active inflammation.  Pelvic images demonstrate a perianal abscess crossing midline to form a horseshoe abscess. This measures 2.6 x 5.6 cm in AP and transverse dimension. Remaining pelvic structures are within normal.  Mild compression deformity of L2 unchanged.  IMPRESSION: Perianal horseshoe abscess measuring 2.6 x 5.6 cm in AP and transverse dimension.  Mild stable splenomegaly.  Possible tiny single gallstone.  Sub cm left renal cortical hypodensity unchanged and too small to characterize but likely a cyst.  Diverticulosis of the colon.  Stable mild L2 compression fracture.  These results were called by telephone at the time of interpretation on 11/15/2014 at 12:47 pm to Dr. Samuel Jester , who verbally acknowledged these results.   Electronically Signed   By: Elberta Fortis M.D.   On: 11/15/2014 12:43    Microbiology: No results found for this or any previous visit (from the past 240 hour(s)).   Labs: Basic Metabolic Panel:  Recent Labs Lab  11/15/14 1038  11/16/14 0054 11/16/14 0518 11/16/14 1122 11/17/14 0603 11/18/14 0545  NA 121*  < > 124* 127* 128* 126* 131*  K 3.7  < > 3.2* 3.4* 3.7 3.5 3.5  CL 86*  < > 93* 94* 92* 93* 98*  CO2 21*  < > GLUCOSE 106*  < > 144* 103* 102* 113* 98  BUN 7  < > 6 6 5* <5* <5*  CREATININE 0.73  < > 0.65 0.64 0.69 0.57* 0.51*  CALCIUM 8.9  < > 7.9* 8.0* 8.1* 7.9* 8.1*  MG 1.7  --   --   --   --   --   --   < > = values in this interval not displayed. Liver Function Tests:  Recent Labs Lab 11/15/14 1038  AST 24  ALT 20  ALKPHOS 117  BILITOT 1.1  PROT 7.9  ALBUMIN 3.7   No results for input(s): LIPASE, AMYLASE in the last 168 hours. No results for input(s): AMMONIA in the last 168 hours. CBC:  Recent Labs Lab 11/15/14 1038 11/16/14 1122  WBC 15.6* 8.9  NEUTROABS 13.0*  --   HGB  14.5 11.7*  HCT 41.5 34.9*  MCV 86.5 88.4  PLT 231 163   Cardiac Enzymes: No results for input(s): CKTOTAL, CKMB, CKMBINDEX, TROPONINI in the last 168 hours. BNP: BNP (last 3 results) No results for input(s): BNP in the last 8760 hours.  ProBNP (last 3 results) No results for input(s): PROBNP in the last 8760 hours.  CBG: No results for input(s): GLUCAP in the last 168 hours.     SignedMauro Kaufmann:  Rollins Wrightson S  [facility] 11/18/2014, 8:29 AM

## 2014-12-08 ENCOUNTER — Inpatient Hospital Stay (HOSPITAL_COMMUNITY)
Admission: EM | Admit: 2014-12-08 | Discharge: 2014-12-10 | DRG: 082 | Disposition: A | Discharge status: Home / Self Care | Payer: Medicaid Other | Attending: Internal Medicine | Admitting: Internal Medicine

## 2014-12-08 ENCOUNTER — Encounter (HOSPITAL_COMMUNITY): Payer: Self-pay | Admitting: Cardiology

## 2014-12-08 ENCOUNTER — Emergency Department (HOSPITAL_COMMUNITY)
Admission: EM | Admit: 2014-12-08 | Discharge: 2014-12-08 | Discharge status: Against Medical Advice | Payer: Medicaid Other | Source: Home / Self Care | Attending: Emergency Medicine | Admitting: Emergency Medicine

## 2014-12-08 ENCOUNTER — Inpatient Hospital Stay (HOSPITAL_COMMUNITY): Payer: Medicaid Other

## 2014-12-08 ENCOUNTER — Emergency Department (HOSPITAL_COMMUNITY): Payer: Medicaid Other

## 2014-12-08 ENCOUNTER — Encounter (HOSPITAL_COMMUNITY): Payer: Self-pay | Admitting: Emergency Medicine

## 2014-12-08 DIAGNOSIS — W1830XA Fall on same level, unspecified, initial encounter: Secondary | ICD-10-CM | POA: Diagnosis not present

## 2014-12-08 DIAGNOSIS — I609 Nontraumatic subarachnoid hemorrhage, unspecified: Secondary | ICD-10-CM | POA: Diagnosis present

## 2014-12-08 DIAGNOSIS — T1490XA Injury, unspecified, initial encounter: Secondary | ICD-10-CM

## 2014-12-08 DIAGNOSIS — E871 Hypo-osmolality and hyponatremia: Secondary | ICD-10-CM | POA: Diagnosis not present

## 2014-12-08 DIAGNOSIS — F10129 Alcohol abuse with intoxication, unspecified: Secondary | ICD-10-CM | POA: Diagnosis present

## 2014-12-08 DIAGNOSIS — G9340 Encephalopathy, unspecified: Secondary | ICD-10-CM | POA: Diagnosis present

## 2014-12-08 DIAGNOSIS — S066X9A Traumatic subarachnoid hemorrhage with loss of consciousness of unspecified duration, initial encounter: Secondary | ICD-10-CM | POA: Diagnosis not present

## 2014-12-08 DIAGNOSIS — S0291XA Unspecified fracture of skull, initial encounter for closed fracture: Secondary | ICD-10-CM

## 2014-12-08 DIAGNOSIS — F1010 Alcohol abuse, uncomplicated: Secondary | ICD-10-CM | POA: Diagnosis not present

## 2014-12-08 DIAGNOSIS — E861 Hypovolemia: Secondary | ICD-10-CM | POA: Diagnosis present

## 2014-12-08 DIAGNOSIS — R6510 Systemic inflammatory response syndrome (SIRS) of non-infectious origin without acute organ dysfunction: Secondary | ICD-10-CM | POA: Diagnosis present

## 2014-12-08 DIAGNOSIS — F17210 Nicotine dependence, cigarettes, uncomplicated: Secondary | ICD-10-CM | POA: Diagnosis not present

## 2014-12-08 DIAGNOSIS — Z09 Encounter for follow-up examination after completed treatment for conditions other than malignant neoplasm: Secondary | ICD-10-CM

## 2014-12-08 LAB — CBC
HCT: 42.7 % (ref 39.0–52.0)
Hemoglobin: 14.9 g/dL (ref 13.0–17.0)
MCH: 30.2 pg (ref 26.0–34.0)
MCHC: 34.9 g/dL (ref 30.0–36.0)
MCV: 86.4 fL (ref 78.0–100.0)
Platelets: 341 10*3/uL (ref 150–400)
RBC: 4.94 MIL/uL (ref 4.22–5.81)
RDW: 15.4 % (ref 11.5–15.5)
WBC: 9.5 10*3/uL (ref 4.0–10.5)

## 2014-12-08 LAB — APTT: APTT: 32 s (ref 24–37)

## 2014-12-08 LAB — I-STAT CHEM 8, ED
BUN: 6 mg/dL (ref 6–20)
CHLORIDE: 95 mmol/L — AB (ref 101–111)
CREATININE: 1 mg/dL (ref 0.61–1.24)
Calcium, Ion: 1.08 mmol/L — ABNORMAL LOW (ref 1.13–1.30)
Glucose, Bld: 118 mg/dL — ABNORMAL HIGH (ref 65–99)
HCT: 48 % (ref 39.0–52.0)
Hemoglobin: 16.3 g/dL (ref 13.0–17.0)
Potassium: 3.9 mmol/L (ref 3.5–5.1)
Sodium: 131 mmol/L — ABNORMAL LOW (ref 135–145)
TCO2: 22 mmol/L (ref 0–100)

## 2014-12-08 LAB — ETHANOL: Alcohol, Ethyl (B): 289 mg/dL — ABNORMAL HIGH (ref <5)

## 2014-12-08 LAB — PROTIME-INR
INR: 1.03 (ref 0.00–1.49)
PROTHROMBIN TIME: 13.7 s (ref 11.6–15.2)

## 2014-12-08 MED ORDER — ONDANSETRON HCL 4 MG/2ML IJ SOLN: 4.0000 mg | Freq: Four times a day (QID) | INTRAMUSCULAR | Status: DC | PRN

## 2014-12-08 MED ORDER — SODIUM CHLORIDE 0.9 % IV SOLN
INTRAVENOUS | Status: DC
  Administered 2014-12-08: 100 mL/h via INTRAVENOUS

## 2014-12-08 MED ORDER — VITAMIN B-12 1000 MCG PO TABS
1000.0000 ug | ORAL_TABLET | Freq: Every day | ORAL | Status: DC
  Administered 2014-12-08 – 2014-12-10 (×3): 1000 ug via ORAL
  Filled 2014-12-08 (×3): qty 1

## 2014-12-08 MED ORDER — VITAMIN B-1 100 MG PO TABS
100.0000 mg | ORAL_TABLET | Freq: Every day | ORAL | Status: DC
  Administered 2014-12-09 – 2014-12-10 (×2): 100 mg via ORAL
  Filled 2014-12-08 (×2): qty 1

## 2014-12-08 MED ORDER — TOBRAMYCIN 0.3 % OP SOLN
1.0000 [drp] | Freq: Four times a day (QID) | OPHTHALMIC | Status: DC
  Administered 2014-12-09 – 2014-12-10 (×4): 1 [drp] via OPHTHALMIC
  Filled 2014-12-08: qty 5

## 2014-12-08 MED ORDER — SODIUM CHLORIDE 0.9 % IJ SOLN
3.0000 mL | Freq: Two times a day (BID) | INTRAMUSCULAR | Status: DC
  Administered 2014-12-10: 3 mL via INTRAVENOUS

## 2014-12-08 MED ORDER — ONDANSETRON HCL 4 MG PO TABS: 4.0000 mg | ORAL_TABLET | Freq: Four times a day (QID) | ORAL | Status: DC | PRN

## 2014-12-08 MED ORDER — SODIUM CHLORIDE 0.9 % IV BOLUS (SEPSIS)
1000.0000 mL | Freq: Once | INTRAVENOUS | Status: AC
  Administered 2014-12-08: 1000 mL via INTRAVENOUS

## 2014-12-08 NOTE — ED Provider Notes (Signed)
Intoxicated patient to be transferred to [facility] per Dr. Karilyn Cota.  Patient found to be hypotensive with blood pressures in the 80s. He is given IV fluids. He is intoxicated and unable to give a history but is complaining of some abdominal pain. Recent admission for rectal abscess and history of PUD. CT abdomen obtained to rule out acute intra-abdominal pathology. Blood pressure improved to 90 systolic. Likely due to intoxication CT is negative for acute pathology appears stable for transfer  Glynn Octave, MD 12/09/14 [phone]

## 2014-12-08 NOTE — ED Notes (Signed)
c-collar on pt.

## 2014-12-08 NOTE — ED Notes (Signed)
In room to recheck blood pressure and pt not in room.  Clothes missing.  Security called.

## 2014-12-08 NOTE — H&P (Signed)
[facility] History and Physical  WINTER TREFZ VWU:[id number] DOB: Jan 10, 1951 DOA: 12/08/2014  Referring physician: ER PCP: Toma Deiters, MD   Chief Complaint: Altered mental status  HPI: Joseph Townsend is a 64 y.o. male  This is a 64 year old man who came earlier to the emergency room, he was found sleeping with decreased level of consciousness by [facility] apparently after a heavy night of drinking beer. He was not able to give any clear history. However, he did admit to drinking six 12 ounce beers last night. Evaluation in the emergency room earlier with a CT brain scan showed subarachnoid hemorrhage. It was recommended that he should be admitted to the hospital but he left the emergency room department without any notice. The Police Department were dispatched to retrieve him and he has come back now and is agreeable to being admitted to the hospital. Unfortunately, he appears to have drunk more alcohol in the meantime.   Review of Systems:  Unable to obtain a history secondary to the patient's altered mental status.  Past Medical History  Diagnosis Date  . PUD (peptic ulcer disease)    Past Surgical History  Procedure Laterality Date  . Back surgery    . Billroth ii     Social History:  reports that he has been smoking Cigarettes.  He has been smoking about 1.00 pack per day. He does not have any smokeless tobacco history on file. He reports that he drinks alcohol. He reports that he does not use illicit drugs.  No Known Allergies   Family history: Unobtainable secondary to the patient's altered mental status.   Prior to Admission medications   Medication Sig Start Date End Date Taking? Authorizing Provider  cyclobenzaprine (FLEXERIL) 10 MG tablet Take 10 mg by mouth 3 (three) times daily as needed for muscle spasms.  09/18/14  Yes Historical Provider, MD  gentamicin (GARAMYCIN) 0.3 % ophthalmic solution Place 1 drop into the right eye daily. 10/27/14  Yes Historical  Provider, MD  HYDROcodone-acetaminophen (NORCO/VICODIN) 5-325 MG per tablet Take 1-2 tablets by mouth every 4 (four) hours as needed for moderate pain. 11/18/14  Yes Joseph Ide, MD  Multiple Vitamin (MULTIVITAMIN) tablet Take 1 tablet by mouth daily.   Yes Historical Provider, MD  naproxen (NAPROSYN) 500 MG tablet Take 500 mg by mouth 2 (two) times daily as needed. pain 09/18/14  Yes Historical Provider, MD  thiamine 100 MG tablet Take 1 tablet (100 mg total) by mouth daily. 11/18/14  Yes Joseph Ide, MD  vitamin B-12 (CYANOCOBALAMIN) 1000 MCG tablet Take 1,000 mcg by mouth daily.   Yes Historical Provider, MD  ciprofloxacin (CIPRO) 500 MG tablet Take 1 tablet (500 mg total) by mouth 2 (two) times daily. Patient not taking: Reported on 12/08/2014 11/18/14   Joseph Ide, MD  metroNIDAZOLE (FLAGYL) 500 MG tablet Take 1 tablet (500 mg total) by mouth 3 (three) times daily. Patient not taking: Reported on 12/08/2014 11/18/14   Joseph Ide, MD   Physical Exam: Filed Vitals:   12/08/14 1557 12/08/14 1600 12/08/14 1630  BP:  Pulse: 85 84 80  Temp: 97.7 F (36.5 C)    TempSrc: Oral    Resp: 22    SpO2: 97% 97% 95%    Wt Readings from Last 3 Encounters:  12/08/14 63.504 kg (140 lb)  11/15/14 62.823 kg (138 lb 8 oz)  10/03/14 65.772 kg (145 lb)    General:  Appears confused. He appears to  be drunk still. Peripheries are warm. He is relatively hypotensive. Eyes: PERRL, normal lids, irises & conjunctiva ENT: grossly normal hearing, lips & tongue Neck: no LAD, masses or thyromegaly Cardiovascular: RRR, no m/r/g. No LE edema. Telemetry: SR, no arrhythmias  Respiratory: CTA bilaterally, no w/r/r. Normal respiratory effort. Abdomen: soft, ntnd Skin: no rash or induration seen on limited exam Musculoskeletal: grossly normal tone BUE/BLE Psychiatric: grossly normal mood and affect, speech fluent and appropriate Neurologic: grossly non-focal.          Labs on Admission:    Basic Metabolic Panel:  Recent Labs Lab 12/08/14 0927  NA 131*  K 3.9  CL 95*  GLUCOSE 118*  BUN 6  CREATININE 1.00   Liver Function Tests: No results for input(s): AST, ALT, ALKPHOS, BILITOT, PROT, ALBUMIN in the last 168 hours. No results for input(s): LIPASE, AMYLASE in the last 168 hours. No results for input(s): AMMONIA in the last 168 hours. CBC:  Recent Labs Lab 12/08/14 0919 12/08/14 0927  WBC 9.5  --   HGB 14.9 16.3  HCT 42.7 48.0  MCV 86.4  --   PLT 341  --    Cardiac Enzymes: No results for input(s): CKTOTAL, CKMB, CKMBINDEX, TROPONINI in the last 168 hours.  BNP (last 3 results) No results for input(s): BNP in the last 8760 hours.  ProBNP (last 3 results) No results for input(s): PROBNP in the last 8760 hours.  CBG: No results for input(s): GLUCAP in the last 168 hours.  Radiological Exams on Admission: Ct Head Wo Contrast  12/08/2014   CLINICAL DATA:  Larey Seat on 12/05/2014 striking LEFT frontal region, initial encounter  EXAM: CT HEAD WITHOUT CONTRAST  TECHNIQUE: Contiguous axial images were obtained from the base of the skull through the vertex without intravenous contrast.  COMPARISON:  None  FINDINGS: Generalized atrophy.  Normal ventricular morphology.  No midline shift or mass effect.  High attenuation subarachnoid blood identified in the anterior aspect of the LEFT middle cranial fossa and at the anterior aspect of the sylvian fissure.  No intraparenchymal hemorrhage, mass lesion, or evidence acute infarction.  Nasal septal deviation to the LEFT.  Bones demineralized.  Question nondisplaced RIGHT parietal fracture.  a  Paranasal sinuses and mastoid air cells clear.  IMPRESSION: Acute subarachnoid hemorrhage at anterior LEFT sylvian fissure and anterior middle cranial fossa.  Question nondisplaced RIGHT parietal fracture.  Critical Value/emergent results were called by telephone at the time of interpretation on 12/08/2014 at 0947 hr to Dr. Eber Hong , who  verbally acknowledged these results.   Electronically Signed   By: Ulyses Southward M.D.   On: 12/08/2014 09:49   Ct Cervical Spine Wo Contrast  12/08/2014   CLINICAL DATA:  Recent fall, intracranial hemorrhage  EXAM: CT CERVICAL SPINE WITHOUT CONTRAST  TECHNIQUE: Multidetector CT imaging of the cervical spine was performed without intravenous contrast. Multiplanar CT image reconstructions were also generated.  COMPARISON:  12/11/2012  FINDINGS: Bones appear demineralized.  Visualized skullbase intact.  Disc space narrowing with endplate spur formation C3-C4 through C6-C7.  Minimal retrolisthesis at C4-C5 and C5-C6.  Multilevel facet degenerative changes cervical spine.  No acute fracture, additional subluxation or bone destruction.  Emphysematous changes and scarring at lung apices.  Subcutaneous cyst posterior cervical region 24 x 10 x 20 mm in size question sebaceous cyst, little changed.  IMPRESSION: Multilevel degenerative disc and facet disease changes of the cervical spine.  No acute cervical spine abnormalities.   Electronically Signed   By: Loraine Leriche  Tyron Russell M.D.   On: 12/08/2014 11:05      Assessment/Plan   1. Acute subarachnoid hemorrhage at the anterior left sylvian fissure and anterior middle cranial fossa. The emergency room physician, Dr. Hyacinth Meeker, had spoken with neurosurgery, Dr. Newell Coral, who recommended CT brain scan in the morning after inpatient stay overnight. 2. Hypotension. This may be likely related to hypovolemia. IV fluids will be given. 3. Alcohol abuse. He appears to be intoxicated at the present time. I would not anticipate withdrawal for another 48 hours. We'll need to monitor for this.  Further recommendations will depend on patient's hospital progress. He will be transferred to [facility] to a stepdown unit. Neurosurgery will need to see the patient in consultation.   Code Status: Full code.   DVT Prophylaxis: SCDs.  Family Communication: No family members at the  bedside.   Disposition Plan: Depending on progress.  Time spent: 60 minutes.  Wilson Singer [facility] Pager [phone].

## 2014-12-08 NOTE — ED Notes (Addendum)
Primary RN stating patient is not in room and can't find him in the department. Charge RN went outside to smoking area. Patient found smoking cigarette. When patient saw RN, patient walked across the street. RN did catch up to patient. RN attempted to get patient back to ED. Explained to patient the severity/concern over the bleeding on his brain and that the possibility of deteriorating condition. Patient states he is not staying and that he "does not care if I die, I'd rather die at home than here". Patient then started walking away again and would not stop for RN or talk with other staff (security). MD notified.

## 2014-12-08 NOTE — ED Provider Notes (Addendum)
CSN: [id number]     Arrival date & time 12/08/14  0824 History  This chart was scribed for Joseph Hong, MD by Placido Sou, ED scribe. This patient was seen in room [facility]04/[facility] and the patient's care was started at 8:43 AM.    Chief Complaint  Patient presents with  . Altered Mental Status   The history is provided by the patient. No language interpreter was used.    HPI Comments: Joseph Townsend is a 64 y.o. male who presents to the Emergency Department by ambulance due to an altered mental status. Pt was found sleeping with a decreased level of consciousness by a [facility] after a night of heavy drinking. Pt notes "sitting on the bench" and was then brought in to the Emergency Department and he's unsure of why he's here. Pt was seen 3 weeks ago and was admitted to hospital for a hyponitremia of 121 and a perianal abscess requiring surgery and he is aware of his follow up dates with the surgeon. He denies any negative symptoms and states that "nothing is wrong with me". He confirms drinking 6, 12 oz beers last night and denies nausea, vomiting, diarrhea or any other associated physical complaints.    Past Medical History  Diagnosis Date  . PUD (peptic ulcer disease)    Past Surgical History  Procedure Laterality Date  . Back surgery    . Billroth ii     History reviewed. No pertinent family history. History  Substance Use Topics  . Smoking status: Current Every Day Smoker -- 1.00 packs/day    Types: Cigarettes  . Smokeless tobacco: Not on file  . Alcohol Use: 0.0 oz/week    0 Standard drinks or equivalent per week     Comment: has been a while    Review of Systems  Gastrointestinal: Negative for nausea, vomiting and diarrhea.  All other systems reviewed and are negative.     Allergies  Review of patient's allergies indicates no known allergies.  Home Medications   Prior to Admission medications   Medication Sig Start Date End Date Taking? Authorizing Provider   cyclobenzaprine (FLEXERIL) 10 MG tablet Take 10 mg by mouth 3 (three) times daily as needed for muscle spasms.  09/18/14  Yes Historical Provider, MD  gentamicin (GARAMYCIN) 0.3 % ophthalmic solution Place 1 drop into the right eye daily. 10/27/14  Yes Historical Provider, MD  HYDROcodone-acetaminophen (NORCO/VICODIN) 5-325 MG per tablet Take 1-2 tablets by mouth every 4 (four) hours as needed for moderate pain. 11/18/14  Yes Meredeth Ide, MD  Multiple Vitamin (MULTIVITAMIN) tablet Take 1 tablet by mouth daily.   Yes Historical Provider, MD  naproxen (NAPROSYN) 500 MG tablet Take 500 mg by mouth 2 (two) times daily as needed. pain 09/18/14  Yes Historical Provider, MD  thiamine 100 MG tablet Take 1 tablet (100 mg total) by mouth daily. 11/18/14  Yes Meredeth Ide, MD  vitamin B-12 (CYANOCOBALAMIN) 1000 MCG tablet Take 1,000 mcg by mouth daily.   Yes Historical Provider, MD  ciprofloxacin (CIPRO) 500 MG tablet Take 1 tablet (500 mg total) by mouth 2 (two) times daily. Patient not taking: Reported on 12/08/2014 11/18/14   Meredeth Ide, MD  metroNIDAZOLE (FLAGYL) 500 MG tablet Take 1 tablet (500 mg total) by mouth 3 (three) times daily. Patient not taking: Reported on 12/08/2014 11/18/14   Meredeth Ide, MD   BP 80/62 mmHg  Pulse 101  Temp(Src) 96.8 F (36 C) (Oral)  Resp 16  Ht  (1.676 m)  Wt 140 lb (63.504 kg)  BMI 22.61 kg/m2  SpO2 98% Physical Exam  Constitutional: He appears well-developed and well-nourished. No distress.  Smells of alcohol; no general acute distress;   HENT:  Head: Normocephalic and atraumatic.  Mouth/Throat: Oropharynx is clear and moist. No oropharyngeal exudate.  Abrasion to right temple; bruise over right forehead; no hematoma; contusion on the top of the scalp left of the midline  Eyes: Conjunctivae and EOM are normal. Pupils are equal, round, and reactive to light. Right eye exhibits no discharge. Left eye exhibits no discharge. No scleral icterus.  Neck: Normal  range of motion. Neck supple. No JVD present. No thyromegaly present.  Cardiovascular: Normal rate, regular rhythm, normal heart sounds and intact distal pulses.  Exam reveals no gallop and no friction rub.   No murmur heard. Pulmonary/Chest: Effort normal and breath sounds normal. No respiratory distress. He has no wheezes. He has no rales.  Abdominal: Soft. Bowel sounds are normal. He exhibits no distension and no mass. There is no tenderness.  Musculoskeletal: Normal range of motion. He exhibits no edema or tenderness.  Bruise to left posterior shoulder  Lymphadenopathy:    He has no cervical adenopathy.  Neurological: He is alert. Coordination normal.  No facial droop; slurred speech; cranial nerves 3-12 normal; normal strength all 4 extremities; normal coordination;   Skin: Skin is warm and dry. No rash noted. No erythema.  Psychiatric: He has a normal mood and affect. His behavior is normal.  Nursing note and vitals reviewed.   ED Course  Procedures  DIAGNOSTIC STUDIES: Oxygen Saturation is 99% on RA, normal by my interpretation.    COORDINATION OF CARE: 8:49 AM Discussed treatment plan with pt at bedside and pt agreed to plan.  Labs Review Labs Reviewed  ETHANOL - Abnormal; Notable for the following:    Alcohol, Ethyl (B) 289 (*)    All other components within normal limits  I-STAT CHEM 8, ED - Abnormal; Notable for the following:    Sodium 131 (*)    Chloride 95 (*)    Glucose, Bld 118 (*)    Calcium, Ion 1.08 (*)    All other components within normal limits  CBC  PROTIME-INR  APTT    Imaging Review Ct Head Wo Contrast  12/08/2014   CLINICAL DATA:  Larey Seat on 12/05/2014 striking LEFT frontal region, initial encounter  EXAM: CT HEAD WITHOUT CONTRAST  TECHNIQUE: Contiguous axial images were obtained from the base of the skull through the vertex without intravenous contrast.  COMPARISON:  None  FINDINGS: Generalized atrophy.  Normal ventricular morphology.  No midline  shift or mass effect.  High attenuation subarachnoid blood identified in the anterior aspect of the LEFT middle cranial fossa and at the anterior aspect of the sylvian fissure.  No intraparenchymal hemorrhage, mass lesion, or evidence acute infarction.  Nasal septal deviation to the LEFT.  Bones demineralized.  Question nondisplaced RIGHT parietal fracture.  a  Paranasal sinuses and mastoid air cells clear.  IMPRESSION: Acute subarachnoid hemorrhage at anterior LEFT sylvian fissure and anterior middle cranial fossa.  Question nondisplaced RIGHT parietal fracture.  Critical Value/emergent results were called by telephone at the time of interpretation on 12/08/2014 at 0947 hr to Dr. Eber Townsend , who verbally acknowledged these results.   Electronically Signed   By: Ulyses Southward M.D.   On: 12/08/2014 09:49   Ct Cervical Spine Wo Contrast  12/08/2014   CLINICAL DATA:  Recent fall,  intracranial hemorrhage  EXAM: CT CERVICAL SPINE WITHOUT CONTRAST  TECHNIQUE: Multidetector CT imaging of the cervical spine was performed without intravenous contrast. Multiplanar CT image reconstructions were also generated.  COMPARISON:  12/11/2012  FINDINGS: Bones appear demineralized.  Visualized skullbase intact.  Disc space narrowing with endplate spur formation C3-C4 through C6-C7.  Minimal retrolisthesis at C4-C5 and C5-C6.  Multilevel facet degenerative changes cervical spine.  No acute fracture, additional subluxation or bone destruction.  Emphysematous changes and scarring at lung apices.  Subcutaneous cyst posterior cervical region 24 x 10 x 20 mm in size question sebaceous cyst, little changed.  IMPRESSION: Multilevel degenerative disc and facet disease changes of the cervical spine.  No acute cervical spine abnormalities.   Electronically Signed   By: Ulyses Southward M.D.   On: 12/08/2014 11:05      MDM   Final diagnoses:  Trauma  SAH (subarachnoid hemorrhage)  Skull fracture, closed, initial encounter    The pt has  a normal level of alertness - he is slurring his speech and appears intoxicated - no tachycardia, no vomiting, no pain, he has evidence of head trauma but does not necessarily appear acute - CT head, labs including ETOH and Na recheck - not having rectal pain / fevers.  The pt has returned to the ED after leaving without informing providers - he was chased but evaded police initially - then was found and return to the ED, he has been drinking more this afternoon - he will need admission - he is intoxicated - no new trauma - he states he is willing to stay - d/w Dr. Karilyn Cota who will facilitate admission.  3:51 PM   I had spoken with the Neurosurgeon Dr. Newell Coral earlier in the day who recommended CT in the morning after medical inpatient admission overnight.  CRITICAL CARE Performed by: Vida Roller Total critical care time: 35 Critical care time was exclusive of separately billable procedures and treating other patients. Critical care was necessary to treat or prevent imminent or life-threatening deterioration. Critical care was time spent personally by me on the following activities: development of treatment plan with patient and/or surrogate as well as nursing, discussions with consultants, evaluation of patient's response to treatment, examination of patient, obtaining history from patient or surrogate, ordering and performing treatments and interventions, ordering and review of laboratory studies, ordering and review of radiographic studies, pulse oximetry and re-evaluation of patient's condition.   Medical screening examination/treatment/procedure(s) were conducted as a shared visit with non-physician practitioner(s) and myself.  I personally evaluated the patient during the encounter.  Clinical Impression:   Final diagnoses:  Trauma  SAH (subarachnoid hemorrhage)  Skull fracture, closed, initial encounter   I personally performed the services described in this documentation, which was  scribed in my presence. The recorded information has been reviewed and is accurate.     Joseph Hong, MD 12/08/14 1551  Joseph Hong, MD 12/08/14 [phone]

## 2014-12-08 NOTE — ED Provider Notes (Signed)
Patient returns in the care of police, he is to be admitted to the hospital, I have informed the hospitalist, they will come see him. Please see my prior note from earlier in the day for complete history and physical with medical decision-making.  Eber Hong, MD 12/08/14 [phone]

## 2014-12-08 NOTE — ED Notes (Signed)
Patient arrives to ED with [employer] after being found [place] the [place] area [place] [employer] drinking ETOH. Patient eloped from [facility] earlier in day. Has skull fracture and Subarachnoid bleed. Patient alert.

## 2014-12-08 NOTE — ED Notes (Signed)
Pt asleep at [facility] on sidewalk.  By stander called EMS.  Pt states he walked from home last night to the drug store.  C/o pain left lower abdomen.  States he has been talking to Dr. Lovell Sheehan and has an appointment this month.

## 2014-12-08 NOTE — ED Notes (Signed)
[place] [employer] called to pick pt up.

## 2014-12-08 NOTE — ED Notes (Signed)
Security and charge nurse encountered pt across street and advised pt to come back.  Pt states he is not coming back and would rather die at home.

## 2014-12-09 ENCOUNTER — Encounter (HOSPITAL_COMMUNITY): Payer: Self-pay | Admitting: *Deleted

## 2014-12-09 ENCOUNTER — Inpatient Hospital Stay (HOSPITAL_COMMUNITY): Payer: Medicaid Other

## 2014-12-09 DIAGNOSIS — F1010 Alcohol abuse, uncomplicated: Secondary | ICD-10-CM

## 2014-12-09 DIAGNOSIS — I609 Nontraumatic subarachnoid hemorrhage, unspecified: Secondary | ICD-10-CM

## 2014-12-09 LAB — URINALYSIS, ROUTINE W REFLEX MICROSCOPIC
BILIRUBIN URINE: NEGATIVE
Glucose, UA: NEGATIVE mg/dL
HGB URINE DIPSTICK: NEGATIVE
Ketones, ur: NEGATIVE mg/dL
Leukocytes, UA: NEGATIVE
Nitrite: NEGATIVE
Protein, ur: NEGATIVE mg/dL
SPECIFIC GRAVITY, URINE: 1.013 (ref 1.005–1.030)
UROBILINOGEN UA: 0.2 mg/dL (ref 0.0–1.0)
pH: 7.5 (ref 5.0–8.0)

## 2014-12-09 LAB — GLUCOSE, CAPILLARY: Glucose-Capillary: 109 mg/dL — ABNORMAL HIGH (ref 65–99)

## 2014-12-09 LAB — MRSA PCR SCREENING: MRSA by PCR: NEGATIVE

## 2014-12-09 MED ORDER — SODIUM CHLORIDE 0.9 % IV SOLN
1.0000 mg | Freq: Once | INTRAVENOUS | Status: AC
  Administered 2014-12-09: 1 mg via INTRAVENOUS
  Filled 2014-12-09: qty 0.2

## 2014-12-09 NOTE — Progress Notes (Signed)
Patient ID: Joseph Townsend, male   DOB: Sep 20, 1950, 64 y.o.   MRN: [id number]  [facility] PROGRESS NOTE  MAIKEL NEISLER WUJ:[id number] DOB: Nov 08, 1950 DOA: 12/08/2014 PCP: Toma Deiters, MD   Brief narrative:    Pt is 64 yo male who presented to [facility] ED after found confused and with decreased level of consciousness by [facility] (apparently after night of heavy drinking). CT head was notable for subarachnoid hemorrhage and recommendation was to admit him to the hospital but pt apparently left AMA and PD had to go find him and pt agreed to come back. Upon return to ED, pt was unable to provide history due to persistent altered mental status.   In ED, VS notable for T 96.8 F, HR up to 101, RR up to 24, SBP initially in 80's. Alcohol level > 200. Pt transferred to [facility] for further evaluation.   Assessment/Plan:    Active Problems:   Acute encephalopathy  - secondary to alcohol intoxication, dehydration, hypotension, acute subarachnoid hemorrhage  - still slow mentation this AM - keep in SDU, repeat CT head for follow up - place on CIWA     Subarachnoid hemorrhage - will request CT head for follow up as recommended by neurosurgeon  - avoid all heparin products - keep on SCD's for DVT prophylaxis    ? SIRS vs hypovolemia - pt meets criteria for SIRS with T 96.8 F, HR up to 101, RR up to 24 with SBP in 80's - this could be related to hypotension in the setting of hypovolemia from dehydration, alcohol intoxication and inadequate oral intake  - unclear if an infectious source present, will ask for UA and CXR to make sure pt has no cardiopulmonary findings (? Aspiration) - will hold off on ABX for now as pt is afebrile and WBC is WNL - monitor vitals closely    Alcohol abuse with EtOH intoxication - alcohol level > 200 - repeat in AM - continue IVF, MVI/Folate/Thiamine  - monitor for signs of withdrawal - discuss cessation once pt more medically stable     Acute  hyponatremia - from pre renal etiology - continue IVF and repeat BMP in AM  DVT prophylaxis - SCD's  Code Status: Full.  Family Communication:  plan of care discussed with the patient Disposition Plan: Keep in SDU  IV access:  Peripheral IV  Procedures and diagnostic studies:    Ct Abdomen Pelvis Wo Contrast 12/08/2014   Perianal soft tissue thickening without residual fluid collection identified.   Ct Head Wo Contrast 12/08/2014  Acute subarachnoid hemorrhage at anterior LEFT sylvian fissure and anterior middle cranial fossa.  Question nondisplaced RIGHT parietal fracture.    Ct Cervical Spine Wo Contrast 12/08/2014    Multilevel degenerative disc and facet disease changes of the cervical spine.  No acute cervical spine abnormalities.     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  Mild stable splenomegaly.  Possible tiny single gallstone.  Sub cm left renal cortical hypodensity unchanged and too small to characterize but likely a cyst.  Diverticulosis of the colon.  Stable mild L2 compression fracture.    Medical Consultants:  None  Other Consultants:  None  IAnti-Infectives:   None  Debbora Presto, MD  [facility] Pager [phone]  If 7PM-7AM, please contact night-coverage [email or web address] Password [facility] 12/09/2014, 9:49 AM   LOS: 1 day   HPI/Subjective: No events overnight.  Objective: Filed Vitals:   12/08/14 2255 12/09/14 0431 12/09/14 0435 12/09/14 0724  BP: 106/65  111/58 120/91  Pulse: 98  93 93  Temp:  98.8 F (37.1 C)  98.3 F (36.8 C)  TempSrc:  Oral  Oral  Resp: 23  19 24   Height:      Weight:      SpO2: 99%  96% 100%    Intake/Output Summary (Last 24 hours) at 12/09/14 0949 Last data filed at 12/09/14 0600  Gross per 24 hour  Intake   1300 ml  Output   3375 ml  Net  -2075 ml    Exam:   General:  Pt is alert, slow mentation, NAD  Cardiovascular: Regular rate  and rhythm, S1/S2, no murmurs, no rubs, no gallops  Respiratory: Clear to auscultation bilaterally, no wheezing, mild rhonchi at bases   Abdomen: Soft, non tender, non distended, bowel sounds present, no guarding  Extremities: No edema, pulses DP and PT palpable bilaterally   Data Reviewed: Basic Metabolic Panel:  Recent Labs Lab 12/08/14 0927  NA 131*  K 3.9  CL 95*  GLUCOSE 118*  BUN 6  CREATININE 1.00   CBC:  Recent Labs Lab 12/08/14 0919 12/08/14 0927  WBC 9.5  --   HGB 14.9 16.3  HCT 42.7 48.0  MCV 86.4  --   PLT 341  --     Recent Results (from the past 240 hour(s))  MRSA PCR Screening     Status: None   Collection Time: 12/09/14  6:51 AM  Result Value Ref Range Status   MRSA by PCR NEGATIVE NEGATIVE Final     Scheduled Meds: . sodium chloride  3 mL Intravenous Q12H  . thiamine  100 mg Oral Daily  . tobramycin  1 drop Right Eye 4 times per day  . vitamin B-12  1,000 mcg Oral Daily   Continuous Infusions: . sodium chloride 100 mL/hr at 12/09/14 0600

## 2014-12-10 DIAGNOSIS — I609 Nontraumatic subarachnoid hemorrhage, unspecified: Secondary | ICD-10-CM | POA: Insufficient documentation

## 2014-12-10 NOTE — Progress Notes (Signed)
Discussed discharge instructions and medications with patient. Patient verbalized understanding with all questions answered. VSS. Pt discharged home with friend.  [facility]

## 2014-12-10 NOTE — Discharge Instructions (Signed)
Subarachnoid Hemorrhage °Subarachnoid hemorrhage is bleeding in the area between the brain and the membrane that covers the brain (subarachnoid space). This increases the pressure on the brain and causes some areas of the brain to be deprived of blood flow. Subarachnoid hemorrhage is a medical emergency that may cause permanent brain damage, stroke, or even death if not treated.  °CAUSES  °· Head injury.   °· Ruptured brain aneurysm.   °· Bleeding from blood vessels that develop abnormally (arteriovenous malformation).   °· Bleeding disorder.   °· Use of blood thinners (anticoagulants). °· Use of certain drugs, such as cocaine. °For some people with subarachnoid hemorrhage, the cause is unknown.  °RISK FACTORS °· Smoking. °· Having high blood pressure (hypertension). °· Abusing alcohol. °· Being a male, especially being of post-menopausal age. °· Having a family history of disease in the blood vessels of the brain (cerebrovascular disease). °· Having certain genetic syndromes that result in kidney disease or connective tissue disease. °SIGNS AND SYMPTOMS  °· A sudden, severe headache with no known cause. The headache is often described as the worst headache ever experienced. °· Nausea or vomiting, especially when combined with other symptoms such as a headache. °· Sudden weakness or numbness of the face, arm, or leg, especially on one side of the body. °· Sudden trouble walking or difficulty moving arms or legs. °· Sudden confusion. °· Sudden personality changes. °· Trouble speaking (aphasia) or understanding. °· Difficulty swallowing. °· Sudden trouble seeing in one or both eyes. °· Double vision. °· Dizziness. °· Loss of balance or coordination. °· Intolerance to light. °· Stiff neck. °DIAGNOSIS  °Your health care provider will perform a physical exam and ask about your symptoms. If a subarachnoid hemorrhage is suspected, various tests may be ordered. These tests may include:  °· A CT scan. °· An MRI. °· A  cerebral angiogram. °· A spinal tap (lumbar puncture). °· Blood tests. °TREATMENT  °Immediate treatment in the hospital is often required to reduce the risk of brain damage. Treatment will depend on the cause of the bleeding, where it is located, and the extent of the bleeding and damage. The goals of treatment include stopping the bleeding, repairing the cause of bleeding, providing relief of symptoms, and preventing problems.  °· Medicines may be given to: °¨ Lower blood pressure (antihypertensives). °¨ Relieve pain (analgesics). °¨ Relieve nausea or vomiting. °· Surgery may also be needed to stop the bleeding, repair the cause of the bleeding, or remove the blood. °· Rehabilitation may be needed to improve any cognitive and day-to-day functions impaired by the condition. °Further treatment depends on the duration, severity, and cause of your symptoms. Physical, speech, and occupational therapists will assess you and work to improve any functions impaired by the subarachnoid hemorrhage. Measures will be taken to prevent short-term and long-term problems, including infection from breathing foreign material into the lungs (aspiration pneumonia), blood clots in the legs, bedsores, and falls. °HOME CARE INSTRUCTIONS °After your hospitalization or inpatient rehabilitation is completed and you are well enough to go home, it is important to prevent a reoccurrence. Take these steps to help prevent this: °· Take medicines only as directed by your health care provider. °· If swallow studies have determined that your swallowing reflex is present, you should eat healthy foods. A diet low in salt (sodium), saturated fat, trans fat, and cholesterol may be recommended to manage high blood pressure. Foods may need to be a special consistency (soft or pureed), or small bites may need to be   taken in order to avoid aspirating or choking. °· Rest and limit activities or movements as directed by your health care provider. °· Do not  use any tobacco products including cigarettes, chewing tobacco, or electronic cigarettes. If you need help quitting, ask your health care provider. °· Limit alcohol intake to no more than 1 drink per day for nonpregnant women and 2 drinks per day for men. One drink equals 12 ounces of beer, 5 ounces of wine, or 1½ ounces of hard liquor. °· Make any other lifestyle changes as directed by your health care provider.  °· Monitor and record your blood pressure as directed by your health care provider. °· A safe home environment is important to reduce the risk of falls. Your health care provider may arrange for specialists to evaluate your home. Having grab bars in the bedroom and bathroom is often important. Your health care provider may arrange for special equipment to be used at home, such as raised toilets and a seat for the shower. °· Physical, occupational, and speech therapy. Ongoing therapy may be needed to maximize your recovery after a subarachnoid bleed. If you have been advised to use a walker or a cane, use it at all times. Be sure to keep your therapy appointments. °· Keep all follow-up visits with your health care provider and other specialists. This includes any referrals, physical therapy, and rehabilitation. °SEEK IMMEDIATE MEDICAL CARE IF:  °· You suddenly have a sudden, severe headache with no known cause. °· You have nausea or vomiting occurring with another symptom. °· You have sudden weakness or numbness of the face, arm, or leg, especially on one side of the body. °· You have sudden trouble walking or difficulty moving arms or legs. °· You have sudden confusion. °· You have trouble speaking (aphasia) or understanding. °· You have sudden trouble seeing in one or both eyes. °· You have a sudden loss of balance or coordination. °· You have a stiff neck. °· You have difficulty breathing. °· You have a partial or total loss of consciousness. °Any of these symptoms may represent a serious problem that is  an emergency. Do not wait to see if the symptoms will go away. Get medical help right away. Call your local emergency services (911 in [place] Do not drive yourself to the hospital. °MAKE SURE YOU:  °· Understand these instructions. °· Will watch your condition. °· Will get help right away if you are not doing well or get worse. °Document Released: 04/23/2004 Document Revised: 10/21/2013 Document Reviewed: 07/20/2012 °Exit[employer]® Patient Information ©2015 [employer]. This information is not intended to replace advice given to you by your health care provider. Make sure you discuss any questions you have with your health care provider. ° °

## 2014-12-10 NOTE — Discharge Summary (Addendum)
Physician Discharge Summary  Joseph Townsend:[id number] DOB: 08-May-1951 DOA: 12/08/2014  PCP: Toma Deiters, MD  Admit date: 12/08/2014 Discharge date: 12/10/2014  Recommendations for Outpatient Follow-up:  1. Pt will need to follow up with PCP in 2-3 weeks post discharge 2. Please obtain BMP to evaluate electrolytes and kidney function 3. Please also check CBC to evaluate Hg and Hct levels  Discharge Diagnoses:  Active Problems:   Alcohol abuse   Subarachnoid hemorrhage   Discharge Condition: Stable  Diet recommendation: Heart healthy diet discussed in details    Brief narrative:    Pt is 64 yo male who presented to [facility] ED after found confused and with decreased level of consciousness by [facility] (apparently after night of heavy drinking). CT head was notable for subarachnoid hemorrhage and recommendation was to admit him to the hospital but pt apparently left AMA and PD had to go find him and pt agreed to come back. Upon return to ED, pt was unable to provide history due to persistent altered mental status.   In ED, VS notable for T 96.8 F, HR up to 101, RR up to 24, SBP initially in 80's. Alcohol level > 200. Pt transferred to [facility] for further evaluation.   Assessment/Plan:    Active Problems:  Acute encephalopathy  - secondary to alcohol intoxication, dehydration, hypotension, acute subarachnoid hemorrhage  - at baseline mental status, d/w neurosurgery on call and since CT head repeated and looks unchanged from the admission CT head, recommendation is to stop drinking, follow up with PCP - pt insisting on going home today    Traumatic Subarachnoid hemorrhage - see recommendations by neurosurgery team above  - pt is at baseline mental status    ? SIRS vs hypovolemia - pt meets criteria for SIRS with T 96.8 F, HR up to 101, RR up to 24 with SBP in 80's - this could be related to hypotension in the setting of hypovolemia from dehydration, alcohol  intoxication and inadequate oral intake  - pt denies any chest pain or shortness of breath, no abd or urinary concerns - no further signs of an infectious etiology noted - no need for ABX    Alcohol abuse with EtOH intoxication - alcohol level > 200 - no sings of withdrawal while hospitalizes  - alcohol cessation consultation provided    Acute hyponatremia - from pre renal etiology - IVF have been provided and pt tolerating diet well, wants to go home   Code Status: Full.  Family Communication: plan of care discussed with the patient Disposition Plan: Home  IV access:  Peripheral IV  Procedures and diagnostic studies:   Ct Abdomen Pelvis Wo Contrast 12/08/2014 Perianal soft tissue thickening without residual fluid collection identified.   Ct Head Wo Contrast 12/08/2014 Acute subarachnoid hemorrhage at anterior LEFT sylvian fissure and anterior middle cranial fossa. Question nondisplaced RIGHT parietal fracture.   Ct Cervical Spine Wo Contrast 12/08/2014 Multilevel degenerative disc and facet disease changes of the cervical spine. No acute cervical spine abnormalities.   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 Mild stable splenomegaly. Possible tiny single gallstone. Sub cm left renal cortical hypodensity unchanged and too small to characterize but likely a cyst. Diverticulosis of the colon. Stable mild L2 compression fracture.   Medical Consultants:  None  Other Consultants:  None  IAnti-Infectives:   None      Discharge Exam: Filed Vitals:   12/10/14  0809  BP: 129/76  Pulse: 95  Temp:   Resp: 21   Filed Vitals:   12/09/14 2235 12/10/14 0342 12/10/14 0800 12/10/14 0809  BP: 131/90 147/85  129/76  Pulse: 91 93  95  Temp:  98 F (36.7 C) 97.8 F (36.6 C)   TempSrc:  Oral Oral   Resp: 20 18  21   Height:      Weight:      SpO2: 100% 100%   100%    General: Pt is alert, follows commands appropriately, not in acute distress Cardiovascular: Regular rate and rhythm, S1/S2 +, no murmurs, no rubs, no gallops Respiratory: Clear to auscultation bilaterally, no wheezing, no crackles, no rhonchi Abdominal: Soft, non tender, non distended, bowel sounds +, no guarding Extremities: no edema, no cyanosis, pulses palpable bilaterally DP and PT  Discharge Instructions  Discharge Instructions    Diet - low sodium heart healthy    Complete by:  As directed      Increase activity slowly    Complete by:  As directed             Medication List    STOP taking these medications        ciprofloxacin 500 MG tablet  Commonly known as:  CIPRO     cyclobenzaprine 10 MG tablet  Commonly known as:  FLEXERIL     HYDROcodone-acetaminophen 5-325 MG per tablet  Commonly known as:  NORCO/VICODIN     metroNIDAZOLE 500 MG tablet  Commonly known as:  FLAGYL      TAKE these medications        gentamicin 0.3 % ophthalmic solution  Commonly known as:  GARAMYCIN  Place 1 drop into the right eye daily.     multivitamin tablet  Take 1 tablet by mouth daily.     naproxen 500 MG tablet  Commonly known as:  NAPROSYN  Take 500 mg by mouth 2 (two) times daily as needed. pain     thiamine 100 MG tablet  Take 1 tablet (100 mg total) by mouth daily.     vitamin B-12 1000 MCG tablet  Commonly known as:  CYANOCOBALAMIN  Take 1,000 mcg by mouth daily.            Follow-up Information    Follow up with [facility], MD.   Specialty:  Internal Medicine   Contact information:   [address] [place] [address] [phone]       The results of significant diagnostics from this hospitalization (including imaging, microbiology, ancillary and laboratory) are listed below for reference.    Microbiology: Recent Results (from the past 240 hour(s))  MRSA PCR Screening     Status: None   Collection Time: 12/09/14  6:51 AM  Result  Value Ref Range Status   MRSA by PCR NEGATIVE NEGATIVE Final   Labs: Basic Metabolic Panel:  Recent Labs Lab 12/08/14 0927  NA 131*  K 3.9  CL 95*  GLUCOSE 118*  BUN 6  CREATININE 1.00   CBC:  Recent Labs Lab 12/08/14 0919 12/08/14 0927  WBC 9.5  --   HGB 14.9 16.3  HCT 42.7 48.0  MCV 86.4  --   PLT 341  --    CBG:  Recent Labs Lab 12/09/14 1646  GLUCAP 109*   SIGNED: Time coordinating discharge: 30 minutes  Debbora Presto, MD  [facility] 12/10/2014, 10:26 AM Pager [phone]  If 7PM-7AM, please contact night-coverage [email or web address] Password [email or web address]

## 2014-12-15 ENCOUNTER — Encounter (HOSPITAL_COMMUNITY): Payer: Self-pay | Admitting: Emergency Medicine

## 2014-12-15 ENCOUNTER — Emergency Department (HOSPITAL_COMMUNITY): Payer: Medicaid Other

## 2014-12-15 ENCOUNTER — Observation Stay (HOSPITAL_COMMUNITY)
Admission: EM | Admit: 2014-12-15 | Discharge: 2014-12-19 | Disposition: E | Payer: Medicaid Other | Attending: General Surgery | Admitting: General Surgery

## 2014-12-15 DIAGNOSIS — S0003XA Contusion of scalp, initial encounter: Secondary | ICD-10-CM | POA: Diagnosis not present

## 2014-12-15 DIAGNOSIS — S0990XA Unspecified injury of head, initial encounter: Secondary | ICD-10-CM | POA: Diagnosis present

## 2014-12-15 DIAGNOSIS — R609 Edema, unspecified: Secondary | ICD-10-CM

## 2014-12-15 DIAGNOSIS — S02401A Maxillary fracture, unspecified, initial encounter for closed fracture: Secondary | ICD-10-CM | POA: Diagnosis not present

## 2014-12-15 DIAGNOSIS — F1020 Alcohol dependence, uncomplicated: Secondary | ICD-10-CM | POA: Diagnosis not present

## 2014-12-15 DIAGNOSIS — E871 Hypo-osmolality and hyponatremia: Secondary | ICD-10-CM | POA: Insufficient documentation

## 2014-12-15 DIAGNOSIS — G935 Compression of brain: Secondary | ICD-10-CM | POA: Diagnosis not present

## 2014-12-15 DIAGNOSIS — S8002XA Contusion of left knee, initial encounter: Secondary | ICD-10-CM | POA: Diagnosis not present

## 2014-12-15 DIAGNOSIS — I609 Nontraumatic subarachnoid hemorrhage, unspecified: Secondary | ICD-10-CM

## 2014-12-15 DIAGNOSIS — S065XAA Traumatic subdural hemorrhage with loss of consciousness status unknown, initial encounter: Secondary | ICD-10-CM

## 2014-12-15 DIAGNOSIS — Y909 Presence of alcohol in blood, level not specified: Secondary | ICD-10-CM | POA: Insufficient documentation

## 2014-12-15 DIAGNOSIS — I959 Hypotension, unspecified: Secondary | ICD-10-CM | POA: Insufficient documentation

## 2014-12-15 DIAGNOSIS — S028XXA Fractures of other specified skull and facial bones, initial encounter for closed fracture: Secondary | ICD-10-CM | POA: Insufficient documentation

## 2014-12-15 DIAGNOSIS — F438 Other reactions to severe stress: Secondary | ICD-10-CM

## 2014-12-15 DIAGNOSIS — T794XXA Traumatic shock, initial encounter: Secondary | ICD-10-CM

## 2014-12-15 DIAGNOSIS — S329XXA Fracture of unspecified parts of lumbosacral spine and pelvis, initial encounter for closed fracture: Secondary | ICD-10-CM

## 2014-12-15 DIAGNOSIS — S069X9A Unspecified intracranial injury with loss of consciousness of unspecified duration, initial encounter: Secondary | ICD-10-CM | POA: Diagnosis present

## 2014-12-15 DIAGNOSIS — S0001XA Abrasion of scalp, initial encounter: Secondary | ICD-10-CM | POA: Diagnosis not present

## 2014-12-15 DIAGNOSIS — S066X9A Traumatic subarachnoid hemorrhage with loss of consciousness of unspecified duration, initial encounter: Secondary | ICD-10-CM | POA: Insufficient documentation

## 2014-12-15 DIAGNOSIS — S065X9A Traumatic subdural hemorrhage with loss of consciousness of unspecified duration, initial encounter: Secondary | ICD-10-CM | POA: Diagnosis not present

## 2014-12-15 DIAGNOSIS — S02402A Zygomatic fracture, unspecified, initial encounter for closed fracture: Secondary | ICD-10-CM | POA: Diagnosis not present

## 2014-12-15 DIAGNOSIS — T1490XA Injury, unspecified, initial encounter: Secondary | ICD-10-CM

## 2014-12-15 DIAGNOSIS — R578 Other shock: Secondary | ICD-10-CM | POA: Insufficient documentation

## 2014-12-15 DIAGNOSIS — S069XAA Unspecified intracranial injury with loss of consciousness status unknown, initial encounter: Secondary | ICD-10-CM | POA: Diagnosis present

## 2014-12-15 DIAGNOSIS — S0291XA Unspecified fracture of skull, initial encounter for closed fracture: Secondary | ICD-10-CM

## 2014-12-15 DIAGNOSIS — I10 Essential (primary) hypertension: Secondary | ICD-10-CM | POA: Diagnosis not present

## 2014-12-15 DIAGNOSIS — S065X0A Traumatic subdural hemorrhage without loss of consciousness, initial encounter: Secondary | ICD-10-CM | POA: Diagnosis present

## 2014-12-15 DIAGNOSIS — Z66 Do not resuscitate: Secondary | ICD-10-CM | POA: Insufficient documentation

## 2014-12-15 DIAGNOSIS — M25462 Effusion, left knee: Secondary | ICD-10-CM | POA: Diagnosis not present

## 2014-12-15 HISTORY — DX: Alcohol dependence, uncomplicated: F10.20

## 2014-12-15 HISTORY — DX: Nontraumatic subarachnoid hemorrhage, unspecified: I60.9

## 2014-12-15 LAB — PROTIME-INR
INR: 1.36 (ref 0.00–1.49)
PROTHROMBIN TIME: 16.9 s — AB (ref 11.6–15.2)

## 2014-12-15 LAB — I-STAT ARTERIAL BLOOD GAS, ED
Acid-base deficit: 14 mmol/L — ABNORMAL HIGH (ref 0.0–2.0)
Bicarbonate: 14 mEq/L — ABNORMAL LOW (ref 20.0–24.0)
O2 Saturation: 100 %
TCO2: 15 mmol/L (ref 0–100)
pCO2 arterial: 40.1 mmHg (ref 35.0–45.0)
pH, Arterial: 7.151 — CL (ref 7.350–7.450)
pO2, Arterial: 364 mmHg — ABNORMAL HIGH (ref 80.0–100.0)

## 2014-12-15 LAB — I-STAT CG4 LACTIC ACID, ED: LACTIC ACID, VENOUS: 4.58 mmol/L — AB (ref 0.5–2.0)

## 2014-12-15 LAB — COMPREHENSIVE METABOLIC PANEL
ALT: 75 U/L — AB (ref 17–63)
AST: 362 U/L — ABNORMAL HIGH (ref 15–41)
Albumin: 2.9 g/dL — ABNORMAL LOW (ref 3.5–5.0)
Alkaline Phosphatase: 156 U/L — ABNORMAL HIGH (ref 38–126)
Anion gap: 15 (ref 5–15)
BUN: 7 mg/dL (ref 6–20)
CALCIUM: 7.6 mg/dL — AB (ref 8.9–10.3)
CO2: 18 mmol/L — ABNORMAL LOW (ref 22–32)
Chloride: 90 mmol/L — ABNORMAL LOW (ref 101–111)
Creatinine, Ser: 0.86 mg/dL (ref 0.61–1.24)
GFR calc Af Amer: >60 mL/min (ref >60)
GLUCOSE: 108 mg/dL — AB (ref 65–99)
Potassium: 4.3 mmol/L (ref 3.5–5.1)
SODIUM: 123 mmol/L — AB (ref 135–145)
Total Bilirubin: 0.8 mg/dL (ref 0.3–1.2)
Total Protein: 6 g/dL — ABNORMAL LOW (ref 6.5–8.1)

## 2014-12-15 LAB — I-STAT CHEM 8, ED
BUN: 7 mg/dL (ref 6–20)
CHLORIDE: 94 mmol/L — AB (ref 101–111)
Calcium, Ion: 0.97 mmol/L — ABNORMAL LOW (ref 1.13–1.30)
Creatinine, Ser: 1.4 mg/dL — ABNORMAL HIGH (ref 0.61–1.24)
Glucose, Bld: 103 mg/dL — ABNORMAL HIGH (ref 65–99)
HEMATOCRIT: 36 % — AB (ref 39.0–52.0)
Hemoglobin: 12.2 g/dL — ABNORMAL LOW (ref 13.0–17.0)
POTASSIUM: 4.3 mmol/L (ref 3.5–5.1)
SODIUM: 121 mmol/L — AB (ref 135–145)
TCO2: 18 mmol/L (ref 0–100)

## 2014-12-15 LAB — CBC
HCT: 31.6 % — ABNORMAL LOW (ref 39.0–52.0)
Hemoglobin: 11.3 g/dL — ABNORMAL LOW (ref 13.0–17.0)
MCH: 29.7 pg (ref 26.0–34.0)
MCHC: 35.8 g/dL (ref 30.0–36.0)
MCV: 83.2 fL (ref 78.0–100.0)
PLATELETS: 283 10*3/uL (ref 150–400)
RBC: 3.8 MIL/uL — ABNORMAL LOW (ref 4.22–5.81)
RDW: 15.7 % — ABNORMAL HIGH (ref 11.5–15.5)
WBC: 24.5 10*3/uL — AB (ref 4.0–10.5)

## 2014-12-15 LAB — ETHANOL: Alcohol, Ethyl (B): 348 mg/dL (ref <5)

## 2014-12-15 MED ORDER — SODIUM CHLORIDE 0.9 % IV SOLN
Freq: Once | INTRAVENOUS | Status: AC
  Administered 2014-12-15: 22:00:00 via INTRAVENOUS

## 2014-12-15 MED ORDER — IOHEXOL 300 MG/ML  SOLN
80.0000 mL | Freq: Once | INTRAMUSCULAR | Status: AC | PRN
  Administered 2014-12-15: 100 mL via INTRAVENOUS

## 2014-12-15 MED ORDER — SODIUM CHLORIDE 0.9 % IV BOLUS (SEPSIS)
1000.0000 mL | Freq: Once | INTRAVENOUS | Status: AC
  Administered 2014-12-15: 1000 mL via INTRAVENOUS

## 2014-12-15 MED ORDER — PHENYLEPHRINE HCL 10 MG/ML IJ SOLN
0.0000 ug/min | Freq: Once | INTRAVENOUS | Status: AC
  Administered 2014-12-15: 20 ug/min via INTRAVENOUS
  Filled 2014-12-15: qty 1

## 2014-12-15 MED ORDER — PROPOFOL 10 MG/ML IV BOLUS
INTRAVENOUS | Status: AC | PRN
  Administered 2014-12-15: 8 ug via INTRAVENOUS

## 2014-12-15 MED ORDER — PROPOFOL 1000 MG/100ML IV EMUL
INTRAVENOUS | Status: AC
  Filled 2014-12-15: qty 100

## 2014-12-15 MED ORDER — SODIUM CHLORIDE 0.9 % IV SOLN: 10.0000 mL/h | Freq: Once | INTRAVENOUS | Status: DC
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
## 2014-12-15 NOTE — Progress Notes (Signed)
Patient transported CT and back. No complications noted.
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
## 2014-12-16 DIAGNOSIS — S069X9A Unspecified intracranial injury with loss of consciousness of unspecified duration, initial encounter: Secondary | ICD-10-CM | POA: Diagnosis present

## 2014-12-16 DIAGNOSIS — S069XAA Unspecified intracranial injury with loss of consciousness status unknown, initial encounter: Secondary | ICD-10-CM | POA: Diagnosis present

## 2014-12-16 DIAGNOSIS — S0990XA Unspecified injury of head, initial encounter: Secondary | ICD-10-CM | POA: Diagnosis present

## 2014-12-16 LAB — PREPARE FRESH FROZEN PLASMA
Unit division: 0
Unit division: 0
Unit division: 0
Unit division: 0
Unit division: 0
Unit division: 0

## 2014-12-16 LAB — TYPE AND SCREEN
ABO/RH(D): O POS
Antibody Screen: NEGATIVE
Unit division: 0
Unit division: 0
Unit division: 0
Unit division: 0
Unit division: 0
Unit division: 0

## 2014-12-16 LAB — ABO/RH: ABO/RH(D): O POS

## 2014-12-16 LAB — BLOOD PRODUCT ORDER (VERBAL) VERIFICATION

## 2014-12-16 LAB — PREPARE RBC (CROSSMATCH)

## 2014-12-16 MED ORDER — SODIUM CHLORIDE 0.9 % IV SOLN: 10.0000 mL/h | Freq: Once | INTRAVENOUS | Status: DC

## 2014-12-16 MED ORDER — SODIUM CHLORIDE 0.9 % IV BOLUS (SEPSIS)
1000.0000 mL | Freq: Once | INTRAVENOUS | Status: AC
  Administered 2014-12-16: 1000 mL via INTRAVENOUS

## 2014-12-16 MED ORDER — PHENYLEPHRINE HCL 10 MG/ML IJ SOLN
0.0000 ug/min | INTRAVENOUS | Status: DC
  Administered 2014-12-16: 200 ug/min via INTRAVENOUS

## 2014-12-16 MED ORDER — PHENYLEPHRINE HCL 10 MG/ML IJ SOLN
0.0000 ug/min | Freq: Once | INTRAVENOUS | Status: DC
  Filled 2014-12-16: qty 4

## 2014-12-16 MED ORDER — FENTANYL CITRATE (PF) 100 MCG/2ML IJ SOLN: 25.0000 ug | INTRAMUSCULAR | Status: DC | PRN

## 2014-12-16 MED ORDER — SODIUM CHLORIDE 0.9 % IV SOLN: INTRAVENOUS | Status: DC

## 2014-12-16 MED FILL — Medication: Qty: 1 | Status: AC

## 2014-12-16 NOTE — ED Notes (Signed)
Blood bank called and informed to have 4 units of blood and 4 units of FFP ready. Do not initiate MTP.

## 2014-12-16 NOTE — ED Notes (Signed)
Neurosurgery at BS

## 2014-12-16 NOTE — ED Notes (Signed)
FFP Unit # [id number] started via rapid infuser.

## 2014-12-16 NOTE — ED Notes (Signed)
Per Dr. Andrey CampanileWilson please have ortho paged for consult. Secretary informed.

## 2014-12-16 NOTE — ED Notes (Signed)
FFP Unit # [id number] stopped.
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
## 2014-12-16 NOTE — ED Notes (Signed)
Chaplain at BS 

## 2014-12-16 NOTE — ED Notes (Signed)
Unsuccessful foley attempts X2 (1 coude attempt)

## 2014-12-16 NOTE — Progress Notes (Signed)
Patient transported from ED to 19M. No complications noted.
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
## 2014-12-16 NOTE — ED Notes (Signed)
Family at bedside. 

## 2014-12-16 NOTE — ED Notes (Signed)
FFP unit # [id number] started via rapid infuser.
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
## 2014-12-16 NOTE — ED Notes (Signed)
FFP unit # [id number] stopped.
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
## 2014-12-16 NOTE — ED Notes (Signed)
Pharmacy called and informed we need another phenylephrine drip.

## 2014-12-17 ENCOUNTER — Encounter (HOSPITAL_COMMUNITY): Payer: Self-pay | Admitting: *Deleted

## 2014-12-19 DEATH — deceased
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
## 2016-01-12 IMAGING — CT CT HEAD W/O CM
1 series · 15 of 30 positions shown, 19 images · non-contrast
Comparison: 12/08/2014

CLINICAL DATA: Status post fall, follow-up subdural hematoma

EXAM:
CT HEAD WITHOUT CONTRAST
TECHNIQUE: Contiguous axial images were obtained from the base of the skull
through the vertex without intravenous contrast.

[Series 2: head 5.0 h30s · axial · 0.44mm/px · z∈[+1290,+1425]mm · 15 of 30 slices shown, 19 images]
[im 2/30  brain]
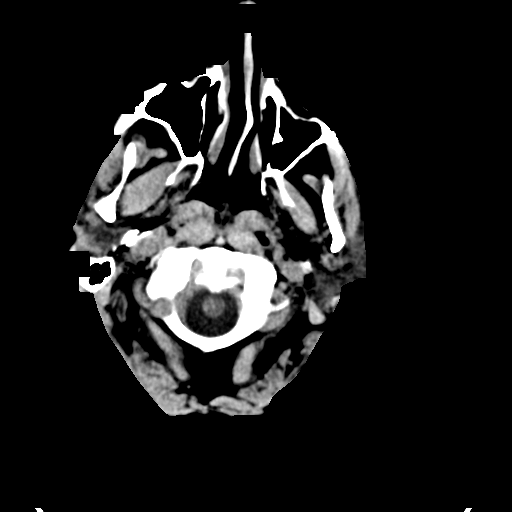
[im 2/30  bone]
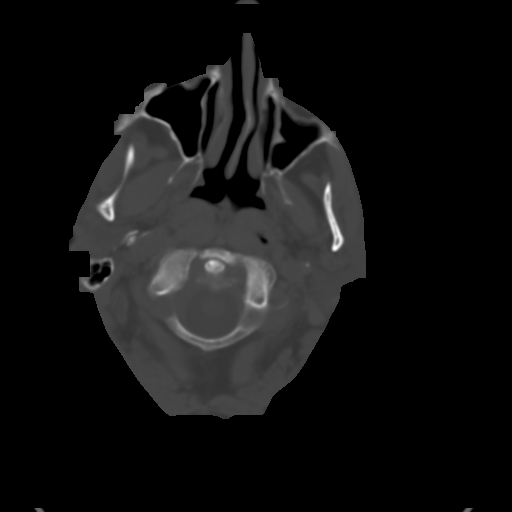
[im 4/30  brain]
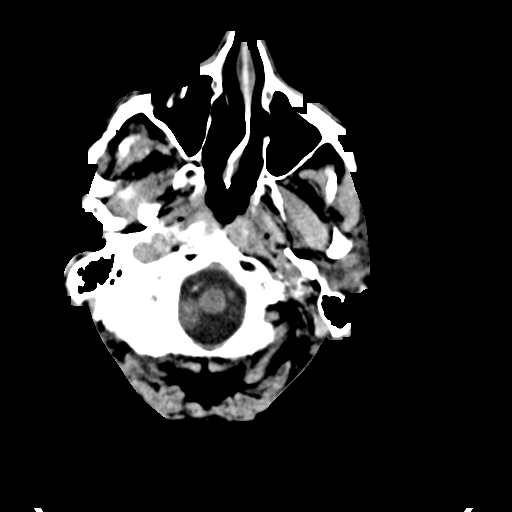
[im 6/30  brain]
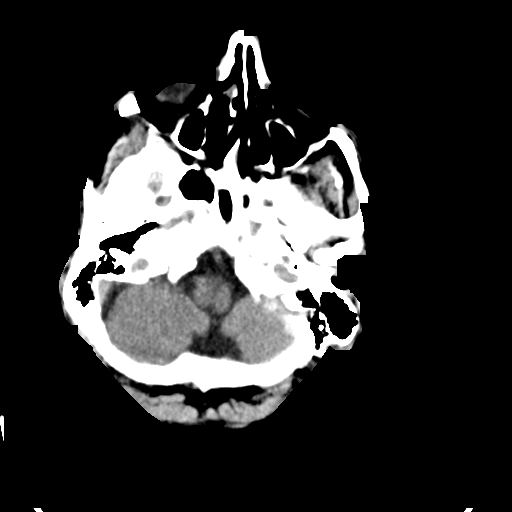
[im 8/30  brain]
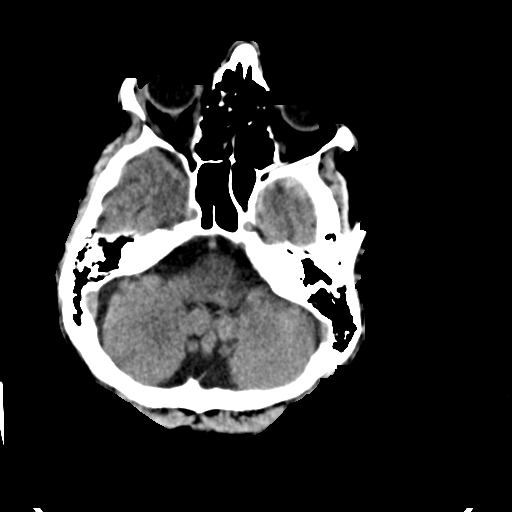
[im 10/30  brain]
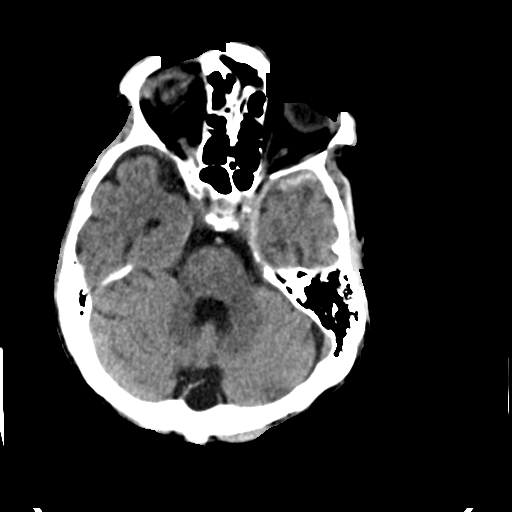
[im 10/30  bone]
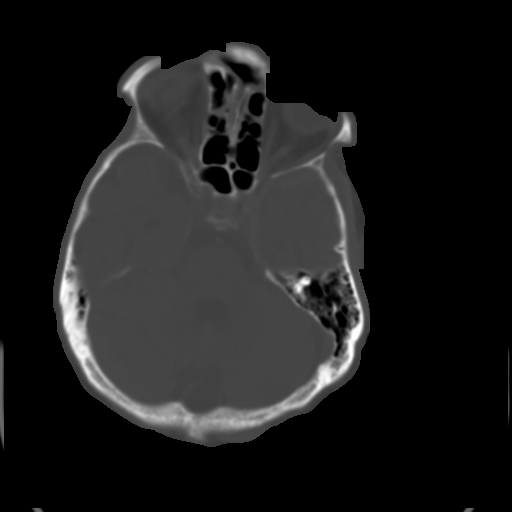
[im 12/30  brain]
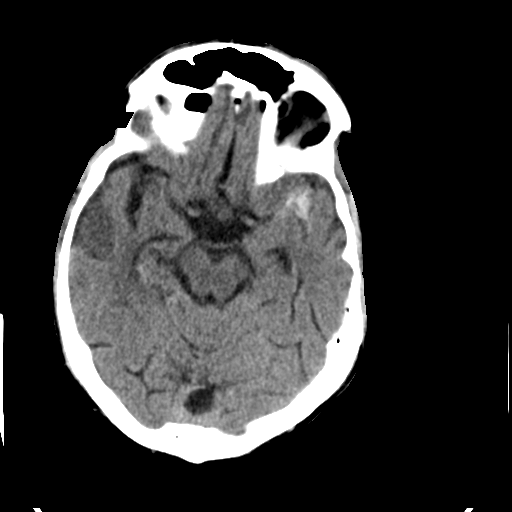
[im 14/30  brain]
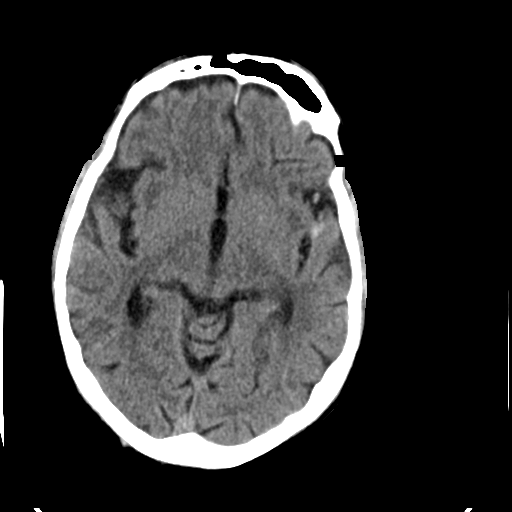
[im 16/30  brain]
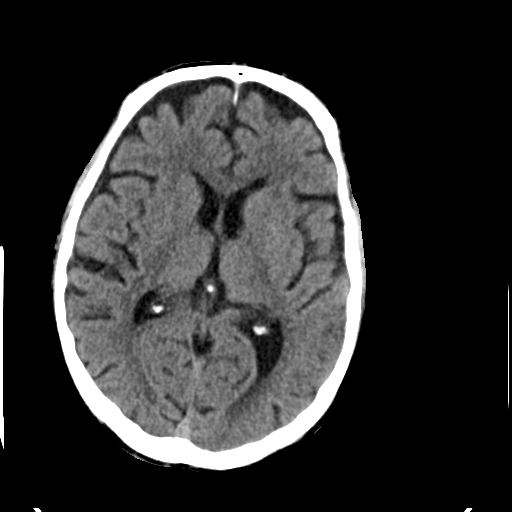
[im 17/30  brain]
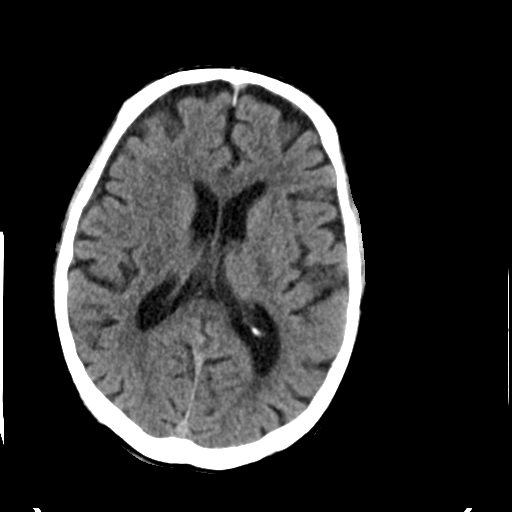
[im 17/30  bone]
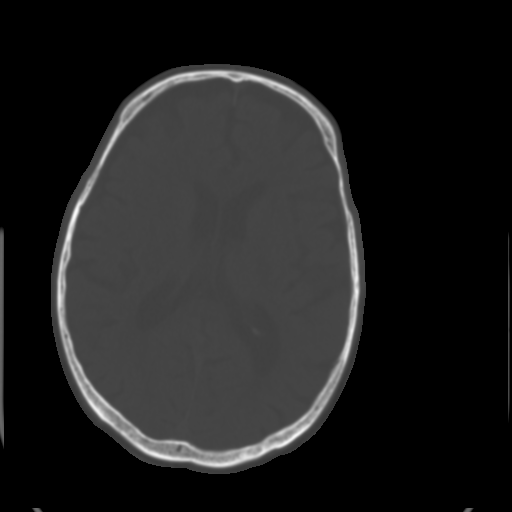
[im 19/30  brain]
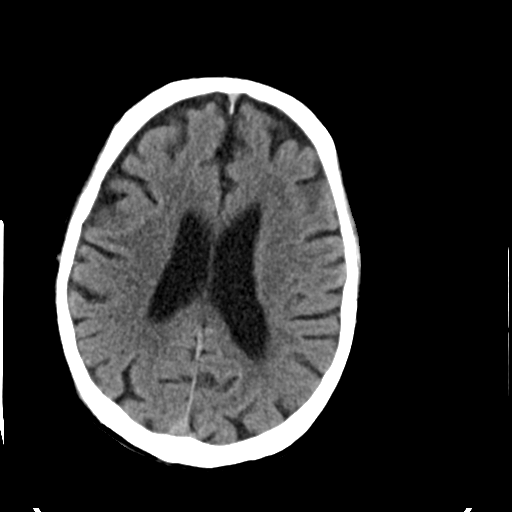
[im 21/30  brain]
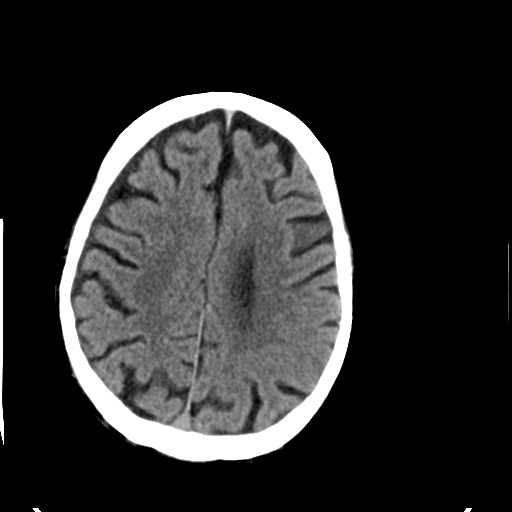
[im 23/30  brain]
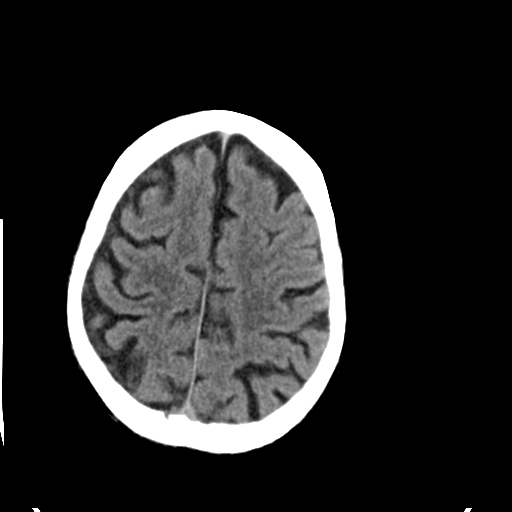
[im 25/30  brain]
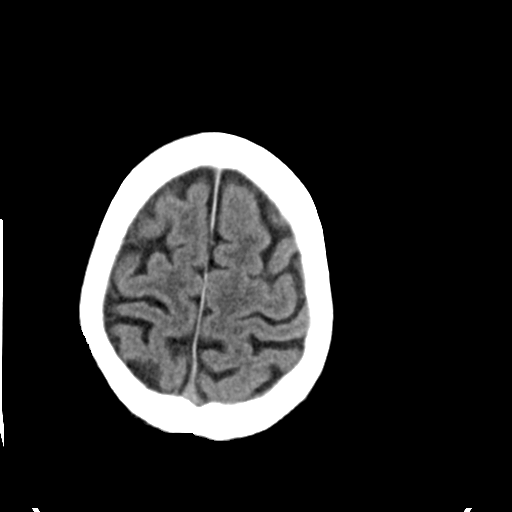
[im 25/30  bone]
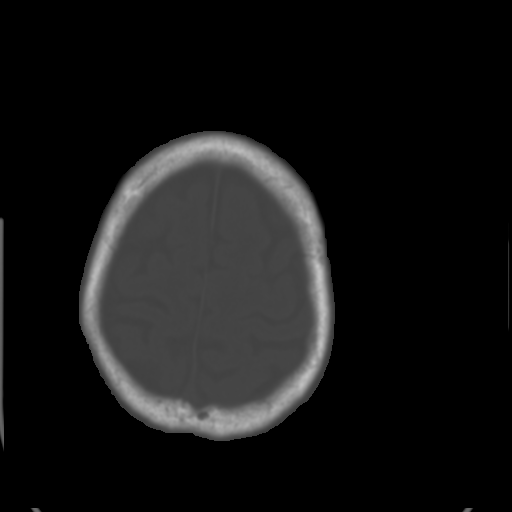
[im 27/30  brain]
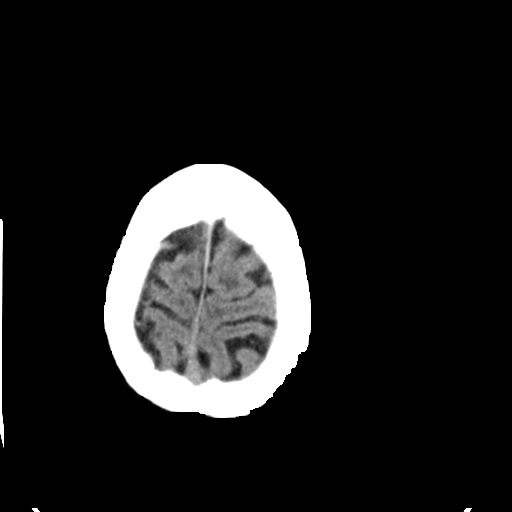
[im 29/30  brain]
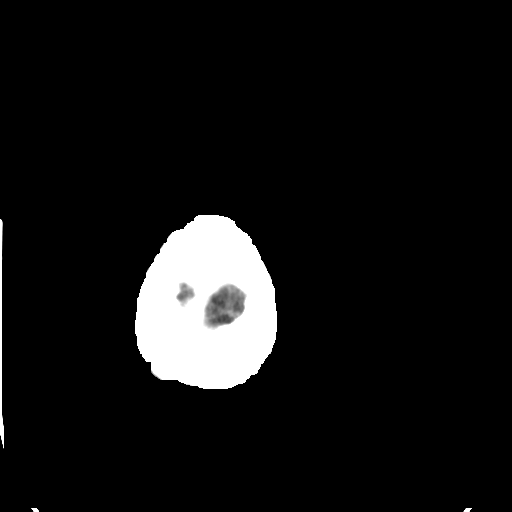

[15 of 30 positions shown; findings below may reference images not displayed]

FINDINGS: Stable foci of subarachnoid hemorrhage along the left sylvian
fissure (series 2/ image 13) and anterior aspect of the left middle
cranial fossa.

Suspected mild subdural hemorrhage medially along the left middle
cranial fossa (series 2/ image 9), unchanged.

No mass effect or midline shift.

No CT evidence of acute infarction.

Mild subcortical white matter and periventricular small vessel
ischemic changes.

Mild cortical atrophy. No ventriculomegaly. No evidence ventricular
hemorrhage.

Mild mucosal thickening in the frontal and sphenoid sinuses. Partial
opacification of the bilateral ethmoid air cells. Bilateral mastoid
air cells are clear.

Possible nondisplaced right parietal fracture (series 3/ image 63),
unchanged.
IMPRESSION: Stable foci of subarachnoid and subdural hemorrhage along the left
sylvian fissure and left middle cranial fossa, as described above.

No mass effect or midline shift. No evidence of intraventricular
hemorrhage.

Possible nondisplaced right parietal fracture, unchanged.

## 2016-01-12 IMAGING — DX DG CHEST 2V
2 series · 4 of 4 positions shown · non-contrast
Comparison: 10/03/2014

CLINICAL DATA: Follow-up

EXAM:
CHEST  2 VIEW

[Series 2: chest lat · 0.14mm/px · 3 of 3 slices shown]
[im 1/3]
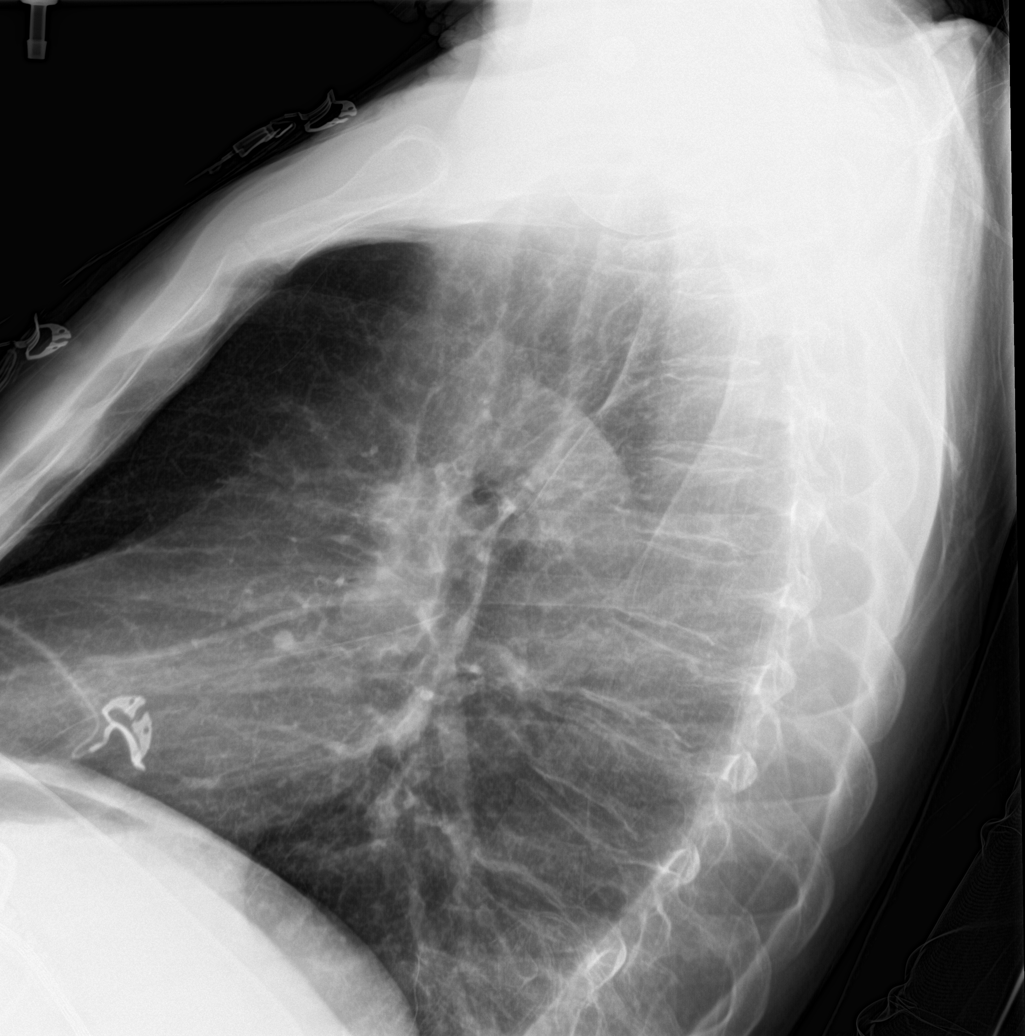
[im 2/3]
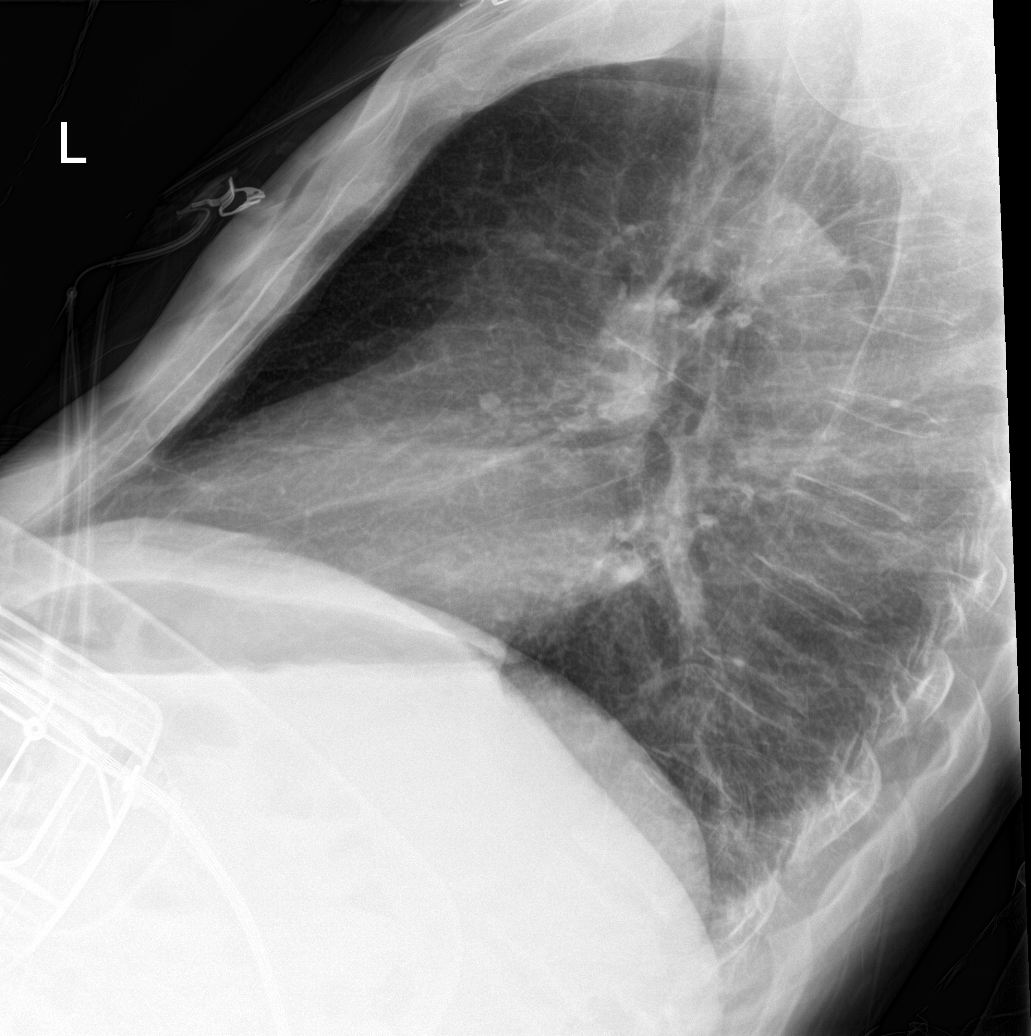
[im 3/3]
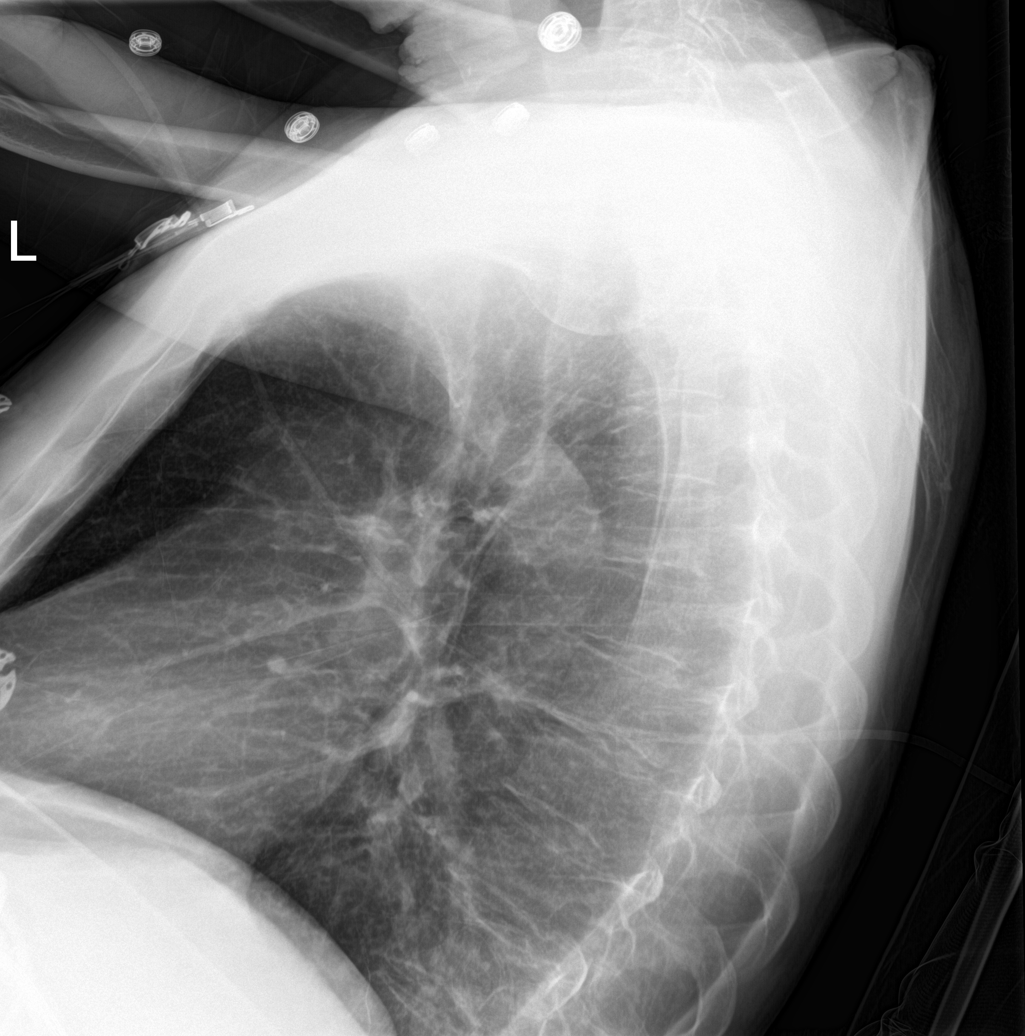

[chest ap]
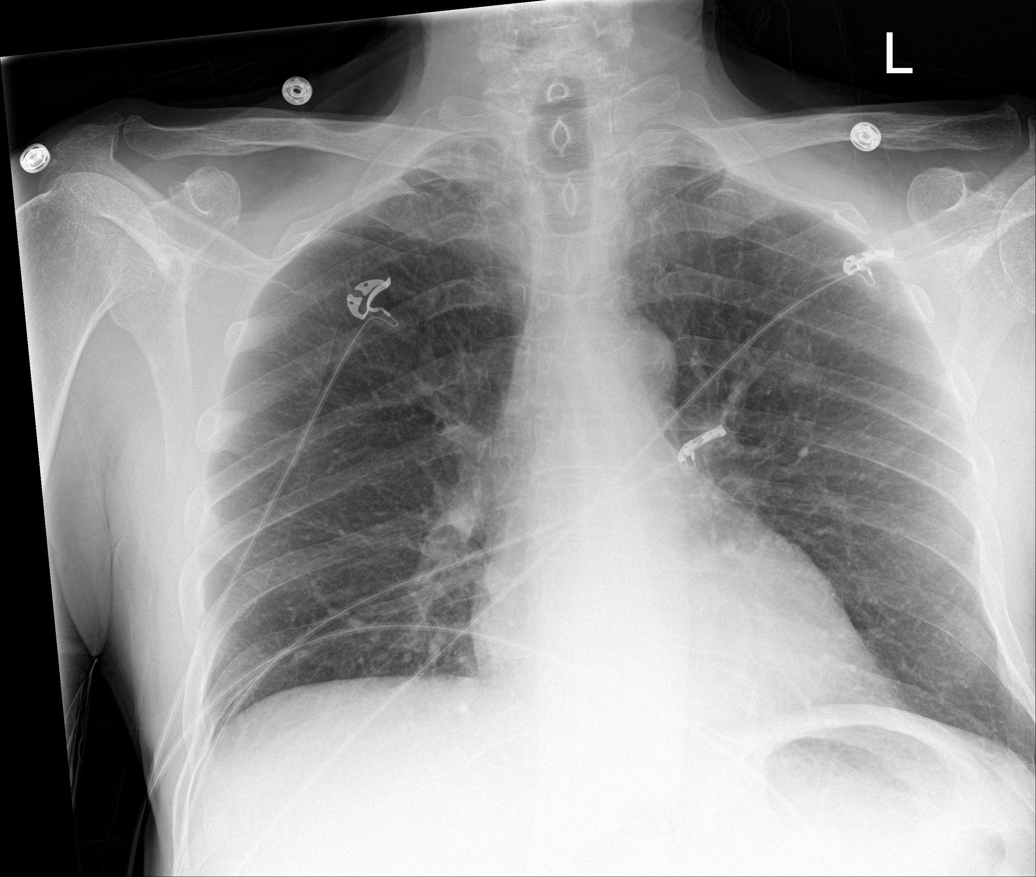

[4 of 4 positions shown; findings below may reference images not displayed]

FINDINGS: Lungs are clear.  No pleural effusion or pneumothorax.

The heart is normal in size.

Visualized osseous structures are within normal limits.
IMPRESSION: No evidence of acute cardiopulmonary disease.
